# Patient Record
Sex: Female | Born: 1960 | Race: White | Hispanic: No | Marital: Single | Smoking: Never smoker
Health system: Southern US, Community
[De-identification: ages and names within clinical notes are randomized; demographics above are authoritative.]

## PROBLEM LIST (undated history)

## (undated) DIAGNOSIS — M1990 Unspecified osteoarthritis, unspecified site: Secondary | ICD-10-CM

## (undated) DIAGNOSIS — G4730 Sleep apnea, unspecified: Secondary | ICD-10-CM

## (undated) DIAGNOSIS — N950 Postmenopausal bleeding: Secondary | ICD-10-CM

## (undated) DIAGNOSIS — D649 Anemia, unspecified: Secondary | ICD-10-CM

## (undated) DIAGNOSIS — E119 Type 2 diabetes mellitus without complications: Secondary | ICD-10-CM

## (undated) DIAGNOSIS — K760 Fatty (change of) liver, not elsewhere classified: Secondary | ICD-10-CM

## (undated) DIAGNOSIS — E785 Hyperlipidemia, unspecified: Secondary | ICD-10-CM

## (undated) DIAGNOSIS — E039 Hypothyroidism, unspecified: Secondary | ICD-10-CM

## (undated) DIAGNOSIS — K635 Polyp of colon: Secondary | ICD-10-CM

## (undated) DIAGNOSIS — N8500 Endometrial hyperplasia, unspecified: Secondary | ICD-10-CM

## (undated) DIAGNOSIS — N840 Polyp of corpus uteri: Secondary | ICD-10-CM

## (undated) DIAGNOSIS — F419 Anxiety disorder, unspecified: Secondary | ICD-10-CM

## (undated) DIAGNOSIS — I10 Essential (primary) hypertension: Secondary | ICD-10-CM

## (undated) HISTORY — PX: JOINT REPLACEMENT: SHX530

## (undated) HISTORY — PX: TONSILLECTOMY: SUR1361

## (undated) HISTORY — PX: ANKLE FRACTURE SURGERY: SHX122

## (undated) HISTORY — PX: MENISCECTOMY: SHX123

## (undated) HISTORY — PX: FRACTURE SURGERY: SHX138

## (undated) HISTORY — DX: Endometrial hyperplasia, unspecified: N85.00

## (undated) HISTORY — PX: DILATION AND CURETTAGE OF UTERUS: SHX78

## (undated) HISTORY — PX: CYSTOSCOPY: SUR368

## (undated) HISTORY — PX: OTHER SURGICAL HISTORY: SHX169

## (undated) SURGERY — Surgical Case
Anesthesia: *Unknown

---

## 2004-09-01 ENCOUNTER — Ambulatory Visit: Payer: Self-pay | Admitting: Gastroenterology

## 2004-10-23 ENCOUNTER — Ambulatory Visit: Payer: Self-pay | Admitting: Podiatry

## 2005-08-24 ENCOUNTER — Ambulatory Visit: Payer: Self-pay

## 2005-09-10 ENCOUNTER — Ambulatory Visit: Payer: Self-pay

## 2006-06-21 ENCOUNTER — Ambulatory Visit: Payer: Self-pay

## 2006-08-09 ENCOUNTER — Ambulatory Visit: Payer: Self-pay

## 2007-01-18 ENCOUNTER — Ambulatory Visit: Payer: Self-pay | Admitting: Internal Medicine

## 2007-02-11 ENCOUNTER — Emergency Department: Payer: Self-pay | Admitting: Emergency Medicine

## 2007-07-14 ENCOUNTER — Ambulatory Visit: Payer: Self-pay | Admitting: Internal Medicine

## 2007-10-29 ENCOUNTER — Ambulatory Visit: Payer: Self-pay | Admitting: Internal Medicine

## 2007-11-21 ENCOUNTER — Ambulatory Visit: Payer: Self-pay | Admitting: Internal Medicine

## 2007-12-05 ENCOUNTER — Ambulatory Visit: Payer: Self-pay | Admitting: Gastroenterology

## 2008-01-23 ENCOUNTER — Ambulatory Visit: Payer: Self-pay | Admitting: Internal Medicine

## 2008-03-12 ENCOUNTER — Ambulatory Visit: Payer: Self-pay

## 2008-06-19 ENCOUNTER — Ambulatory Visit: Payer: Self-pay | Admitting: Orthopaedic Surgery

## 2008-06-28 ENCOUNTER — Ambulatory Visit: Payer: Self-pay | Admitting: Orthopaedic Surgery

## 2009-01-23 ENCOUNTER — Ambulatory Visit: Payer: Self-pay | Admitting: Internal Medicine

## 2010-01-26 ENCOUNTER — Ambulatory Visit: Payer: Self-pay | Admitting: Internal Medicine

## 2011-03-02 ENCOUNTER — Ambulatory Visit: Payer: Self-pay | Admitting: Internal Medicine

## 2011-09-01 ENCOUNTER — Ambulatory Visit: Payer: Self-pay | Admitting: Internal Medicine

## 2012-03-02 ENCOUNTER — Ambulatory Visit: Payer: Self-pay | Admitting: Internal Medicine

## 2012-10-04 ENCOUNTER — Ambulatory Visit: Payer: Self-pay | Admitting: Orthopedic Surgery

## 2012-10-04 LAB — BASIC METABOLIC PANEL
Anion Gap: 8 (ref 7–16)
BUN: 13 mg/dL (ref 7–18)
Calcium, Total: 8.9 mg/dL (ref 8.5–10.1)
Chloride: 103 mmol/L (ref 98–107)
Co2: 27 mmol/L (ref 21–32)
EGFR (Non-African Amer.): >60
Osmolality: 277 (ref 275–301)
Potassium: 4.2 mmol/L (ref 3.5–5.1)
Sodium: 138 mmol/L (ref 136–145)

## 2012-10-04 LAB — CBC
HCT: 37.4 % (ref 35.0–47.0)
HGB: 12.8 g/dL (ref 12.0–16.0)
MCH: 28.6 pg (ref 26.0–34.0)
MCHC: 34.3 g/dL (ref 32.0–36.0)
RDW: 14.9 % — ABNORMAL HIGH (ref 11.5–14.5)

## 2012-10-09 ENCOUNTER — Ambulatory Visit: Payer: Self-pay | Admitting: Orthopedic Surgery

## 2013-01-15 ENCOUNTER — Encounter: Payer: Self-pay | Admitting: Orthopedic Surgery

## 2013-02-06 ENCOUNTER — Encounter: Payer: Self-pay | Admitting: Orthopedic Surgery

## 2013-03-05 ENCOUNTER — Ambulatory Visit: Payer: Self-pay | Admitting: Internal Medicine

## 2013-11-08 HISTORY — PX: CARDIAC CATHETERIZATION: SHX172

## 2014-01-16 LAB — TROPONIN I: Troponin-I: <0.02 ng/mL

## 2014-01-16 LAB — CBC
HCT: 42.3 % (ref 35.0–47.0)
HGB: 14.2 g/dL (ref 12.0–16.0)
MCH: 28.7 pg (ref 26.0–34.0)
MCHC: 33.6 g/dL (ref 32.0–36.0)
MCV: 85 fL (ref 80–100)
Platelet: 261 10*3/uL (ref 150–440)
RBC: 4.96 10*6/uL (ref 3.80–5.20)
RDW: 14.6 % — ABNORMAL HIGH (ref 11.5–14.5)
WBC: 8.2 10*3/uL (ref 3.6–11.0)

## 2014-01-16 LAB — BASIC METABOLIC PANEL
Anion Gap: 8 (ref 7–16)
BUN: 13 mg/dL (ref 7–18)
CHLORIDE: 105 mmol/L (ref 98–107)
CO2: 27 mmol/L (ref 21–32)
CREATININE: 1.05 mg/dL (ref 0.60–1.30)
Calcium, Total: 9 mg/dL (ref 8.5–10.1)
EGFR (Non-African Amer.): >60
GLUCOSE: 105 mg/dL — AB (ref 65–99)
Osmolality: 280 (ref 275–301)
Potassium: 4.1 mmol/L (ref 3.5–5.1)
Sodium: 140 mmol/L (ref 136–145)

## 2014-01-17 ENCOUNTER — Ambulatory Visit: Payer: Self-pay | Admitting: Internal Medicine

## 2014-01-17 LAB — TROPONIN I: Troponin-I: <0.02 ng/mL

## 2014-01-17 LAB — CK TOTAL AND CKMB (NOT AT ARMC)
CK, Total: 141 U/L
CK, Total: 112 U/L
CK, Total: 133 U/L
CK-MB: 1.1 ng/mL (ref 0.5–3.6)
CK-MB: 1.4 ng/mL (ref 0.5–3.6)
CK-MB: 1.8 ng/mL (ref 0.5–3.6)

## 2014-04-29 ENCOUNTER — Ambulatory Visit: Payer: Self-pay | Admitting: Internal Medicine

## 2015-01-02 IMAGING — MG MM DIGITAL SCREENING BILAT W/ CAD
1 series · 5 of 5 positions shown · non-contrast
Comparison: Previous exam(s).

CLINICAL DATA: Screening.

EXAM:
DIGITAL SCREENING BILATERAL MAMMOGRAM WITH CAD

[R CC · right · 5 of 5 slices shown]
[im 1/5]
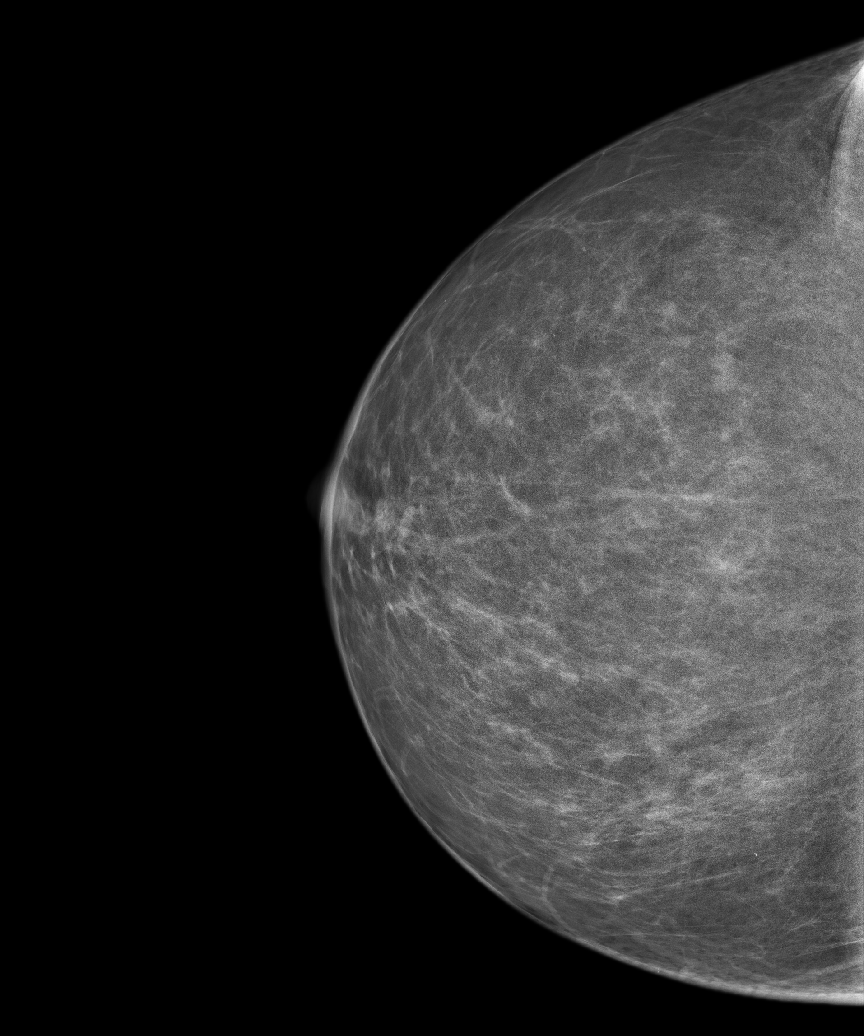
[im 2/5]
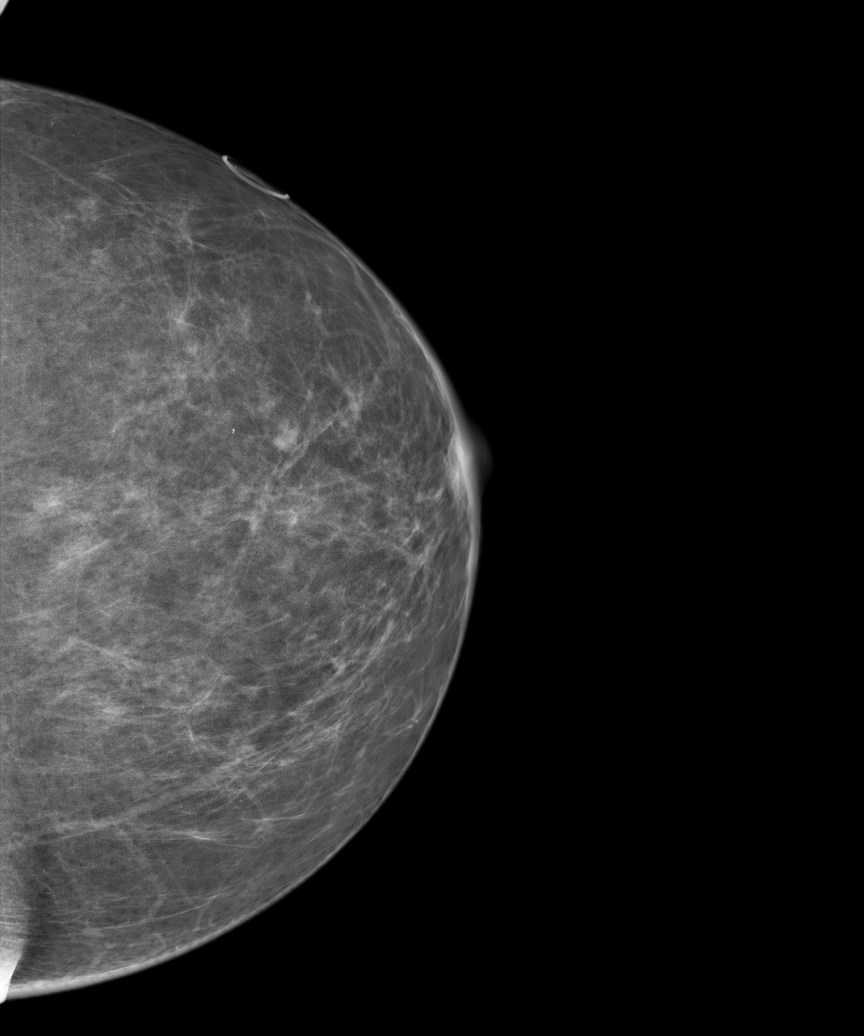
[im 3/5]
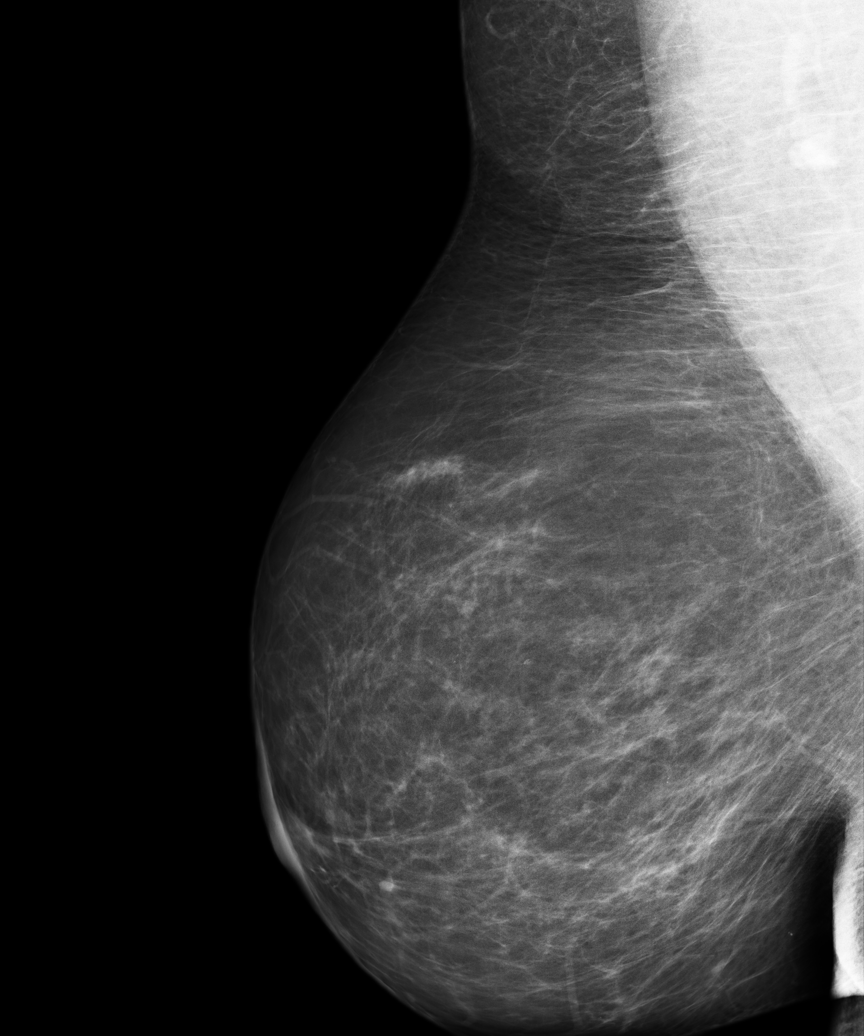
[im 4/5]
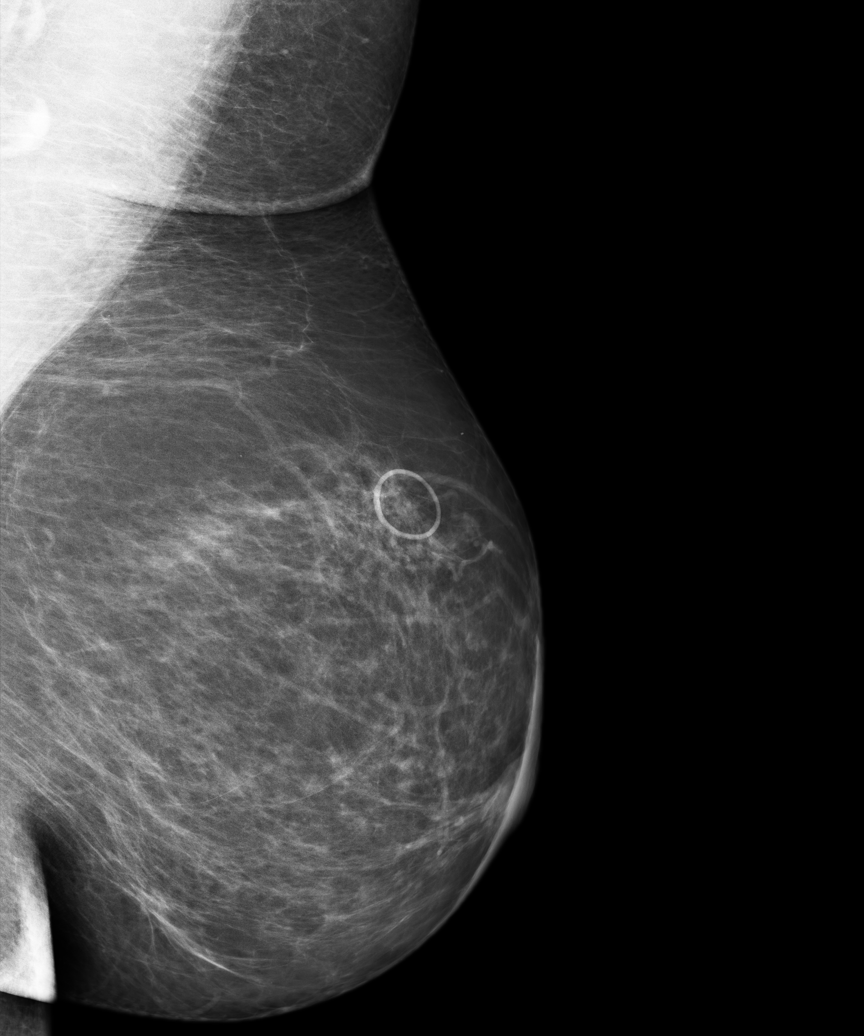
[im 5/5]
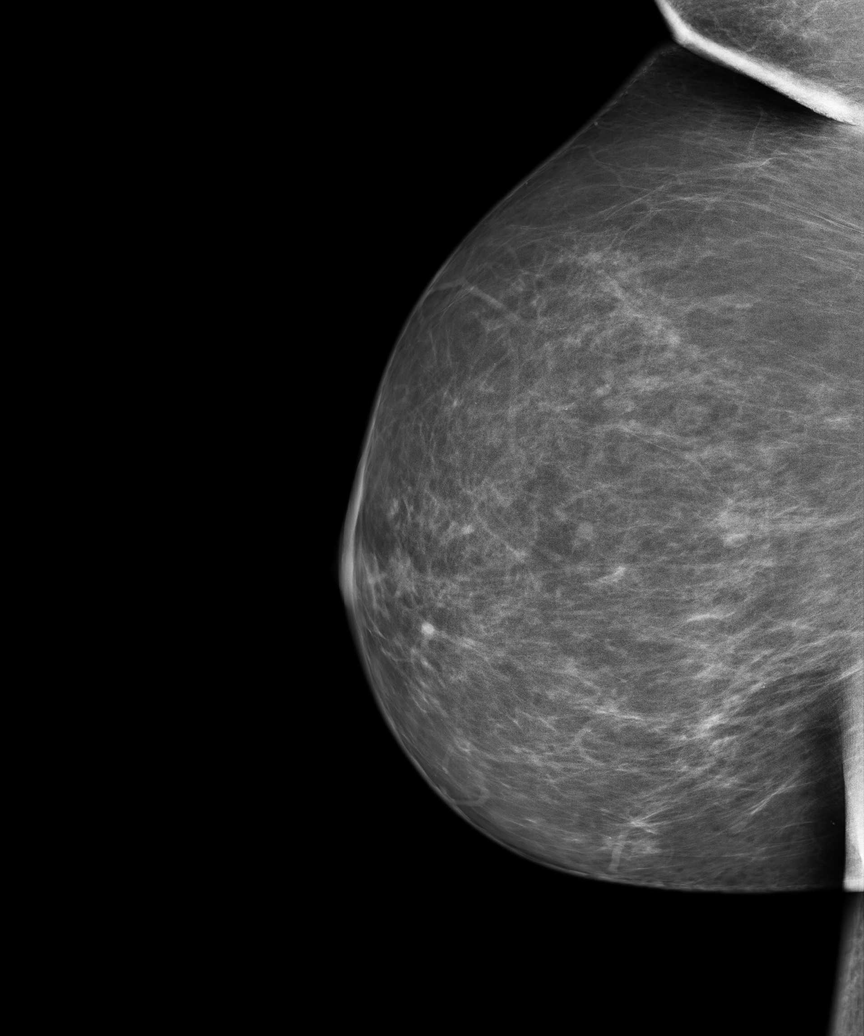

[5 of 5 positions shown; findings below may reference images not displayed]

ACR Breast Density Category b: There are scattered areas of
fibroglandular density.
FINDINGS: There are no findings suspicious for malignancy. Images were
processed with CAD.
IMPRESSION: No mammographic evidence of malignancy. A result letter of this
screening mammogram will be mailed directly to the patient.

RECOMMENDATION:
Screening mammogram in one year. (Code:AS-G-LCT)

BI-RADS CATEGORY  1: Negative.

## 2015-02-25 NOTE — Op Note (Signed)
PATIENT NAME:  Jill Yu, Jill Yu MR#:  [id number] DATE OF BIRTH:  12-Sep-1961  DATE OF PROCEDURE:  10/09/2012  PREOPERATIVE DIAGNOSES:  1. Menorrhagia.  2. 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  3. Thickened endometrium on ultrasound.   POSTOPERATIVE DIAGNOSES:  1. Menorrhagia.  2. 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  3. Thickened endometrium on ultrasound.  4. Endometrial polyps.   OPERATIVE PROCEDURE:  1. Diagnostic hysteroscopy.  2. Dilation and curettage of the endometrium.   SURGEON: Alanda Slim. Brionne Mertz, MD   FIRST ASSISTANT: None.   ANESTHESIA: General.   INDICATIONS: The patient is a 54 year old white female, para zero, with a history of simple endometrial hyperplasia without atypia on several occasions, currently on cyclic Provera with most recent history of menorrhagia, with preoperative ultrasound showing a thickened endometrium, presents for surgical evaluation.   FINDINGS AT SURGERY: Findings at surgery revealed a uterus that was midplane to retroverted and top normal size. There was no significant uterine descensus. The uterus sounded to 9 cm. Hysteroscopy revealed multiple endometrial polyps. The bony pelvis was marginal for vaginal surgery as she had a prominent pubic arch and convergent sidewalls.   DESCRIPTION OF PROCEDURE: The patient was brought to the Operating Room where she was placed in the supine position. General anesthesia was induced without difficulty. The patient had previously had a carpal tunnel release done by Dr. Rudene Christians [facility]. Following completion of that procedure, she was repositioned and placed in the dorsal lithotomy position using the bumblebee stirrups. A Betadine perineal, intravaginal prep and drape was performed in the standard fashion. Bimanual exam revealed the above-noted findings. A Sims retractor was placed posteriorly to facilitate exposure. A single-tooth tenaculum was placed on the  anterior lip of the cervix. The uterus sounded to 9 cm. The [facility] hysteroscope using lactated Ringer's as irrigant was used to identify the intrauterine findings which were photo documented. Smooth and serrated curettes were used to perform the curettage in standard fashion. Stone polyp forceps were used to facilitate polyp removal. Repeat hysteroscopy revealed several persistent polyps and repeat curettage was performed. The hysteroscope verified no significant residual tissue was left behind. The patient was then mobilized at the end of the procedure following removal of all instruments from within the vagina. She was extubated and taken to the recovery room in satisfactory condition. Estimated blood loss was minimal. There were no complications. All instruments, needle and sponge counts were verified as correct.   ADDENDUM NOTE: If the patient requires to have hysterectomy because of persistent hyperplasia, persistent chronic abnormal uterine bleeding, then either abdominal or robotic hysterectomy would be recommended.  ____________________________ Alanda Slim. Gayla Benn, MD mad:cbb D: 10/09/2012 13:31:44 ET T: 10/09/2012 13:54:29 ET JOB#: [id number]  cc: Hassell Done A. Chang Tiggs, MD, <Dictator> Alanda Slim Abrar Bilton MD ELECTRONICALLY SIGNED 10/09/2012 16:24

## 2015-02-25 NOTE — Op Note (Signed)
PATIENT NAME:  Jill Yu, Jill Yu MR#:  [id number] DATE OF BIRTH:  05-28-1961  DATE OF PROCEDURE:  10/09/2012  PREOPERATIVE DIAGNOSIS: Carpal tunnel syndrome.   POSTOPERATIVE DIAGNOSIS: Carpal tunnel syndrome.   PROCEDURE: Right carpal tunnel release.   SURGEON: Laurene Footman, MD  ANESTHESIA: General.   DESCRIPTION OF PROCEDURE: The patient was brought to the Operating Room and after adequate anesthesia was obtained the right arm was prepped and draped in the usual sterile fashion with a tourniquet applied to the upper arm. After appropriate patient identification and time out procedures were completed, the tourniquet was raised to 250 mmHg. An incision was made in the mid palm in line with the ring metacarpal, approximately 2 cm incision made. Skin and subcutaneous tissue was incised and local anesthetic was given as well with 10 mL 0.5% Sensorcaine injected. After getting down to the transverse carpal ligament, the ligament was incised and a hemostat placed deep to the ligament to protect the underlying structures. Release was carried out distally until there was fat found around the nerve. Going more proximally, release was carried out, but there was not good vascular blush to the nerve and the incision was extended an additional centimeter. On exploration of the nerve, there was a band that seemed like an aberrant band of the transcarpal ligament that was pressing directly on the nerve. After release of this, there was good vascular blush to the nerve and it appeared that complete release had been obtained. There was mild flexor tenosynovitis. The wound was irrigated and closed with simple interrupted 5-0 nylon. Additional 5 mL of 0.5% Sensorcaine was injected around the incision to aid in postoperative analgesia. Sterile dressings were applied with Xeroform, 4 x 4's, Webril, and Ace wrap. Tourniquet was let down at the close of the case.   TOURNIQUET TIME: 13 minutes at 250 mmHg.    COMPLICATIONS: None.  SPECIMEN: No specimen.   ESTIMATED BLOOD LOSS: Minimal.   The patient is then to have a procedure by Dr. Hassell Done DeFrancesco, to be dictated separately, unrelated to this surgical procedure. ____________________________ Laurene Footman, MD mjm:slb D: 10/09/2012 14:03:41 ET T: 10/09/2012 14:12:45 ET JOB#: [id number]  cc: Laurene Footman, MD, <Dictator> Laurene Footman MD ELECTRONICALLY SIGNED 10/10/2012 9:45

## 2015-03-01 NOTE — Consult Note (Signed)
Present Illness 54 yo female with no rior cardiac history who presented to the er with complaints of epigastric pain and jaw pain. She had a stress echo done in [facility] office on Monday per Rusty Aus read as inferior ischemia. She was scheduled to she cardiology today. She developed the aforementioned symptoms and presented to the er.Marland Kitchen EKG was unremarkable for ischemia. Her initial troponin was normal. She is improved but still not feeling well. Risk factors include diabetes mellitus and hypertension. She is currently hemodynamically stable   Physical Exam:  GEN well nourished, no acute distress   HEENT PERRL, hearing intact to voice   NECK supple   RESP normal resp effort  clear BS  no use of accessory muscles   CARD Regular rate and rhythm  Normal, S1, S2  No murmur   ABD denies tenderness  no liver/spleen enlargement  no hernia  no Adominal Mass   LYMPH negative neck, negative axillae   EXTR negative cyanosis/clubbing, negative edema   SKIN normal to palpation   NEURO cranial nerves intact, motor/sensory function intact   PSYCH A+O to time, place, person   Review of Systems:  Subjective/Chief Complaint heart burn and episodic jaw discomfort   General: Fatigue   Skin: No Complaints   ENT: No Complaints   Eyes: No Complaints   Neck: Pain   Respiratory: No Complaints   Cardiovascular: Chest pain or discomfort   Gastrointestinal: Heartburn   Genitourinary: No Complaints   Vascular: No Complaints   Musculoskeletal: No Complaints   Neurologic: No Complaints   Hematologic: No Complaints   Endocrine: No Complaints   Psychiatric: No Complaints   Review of Systems: All other systems were reviewed and found to be negative   Medications/Allergies Reviewed Medications/Allergies reviewed   Home Medications: Medication Instructions Status  metFORMIN 1000 mg oral tablet 1 tab(s) orally 2 times a day Active  aspirin 81 mg oral enteric coated tablet 1  orally once  a day (in the evening) DISCONTINUE ONE WEEK BEFORE SURGERY Active  lisinopril 10 mg oral tablet 1  orally 2 times a day TAKE AM OF SURGERY Active  Lantus Solostar Pen 100 units/mL subcutaneous solution 60 unit(s) subcutaneous once a day Active  Apidra SoloStar Pen 100 units/mL subcutaneous solution 4 unit(s) subcutaneous per 10 carbs with at mealtimes Active  atorvastatin 40 mg oral tablet 1 tab(s) orally once a day (at bedtime) Active  citalopram 20 mg oral tablet 1 tab(s) orally once a day (at bedtime) Active  Vitamin B12 2500 mcg sublingual tablet 1 tab(s) sublingual once a day Active   EKG:  EKG Nml  NSR    Cephalosporins: Itching   Impression 54 yo female with history of hyperlipidemia, hypertension and diabetes mellitus who had an abnormal stress echo last week with inferior ischemia. She developed heart burn and jaw pain last pm. She presented to the eer where her ekg and cardiac markers are unremarkable thus far. She is improved clinically. Given abnormal stress echo, risk factors including diabetes, will need to proceed with left cardiac cath to evaluate anatomy. Risk and benefits of cardiac cath explained.   Plan 1. Continue with asa, atorvastatin, and will add beta blockers 2. Continue to rule out for mi 3. Proceed with cardaic cath to evaluate anatomy 4. Further recs pending cath.   Electronic Signatures: Teodoro Spray (MD)  (Signed 12-Mar-15 08:24)  Authored: General Aspect/Present Illness, History and Physical Exam, Review of System, Home Medications, EKG , Allergies, Impression/Plan  Last Updated: 12-Mar-15 08:24 by Teodoro Spray (MD)

## 2015-03-01 NOTE — H&P (Signed)
PATIENT NAME:  Jill Yu, Jill Yu MR#:  [id number] DATE OF BIRTH:  [birth year]  DATE OF ADMISSION:  01/16/2014  REFERRING PHYSICIAN:  Dr. Marjean Donna.   PRIMARY CARE PHYSICIAN:  Dr. Fulton Reek, [facility]   CHIEF COMPLAINT:  Chest pain.   HISTORY OF PRESENT ILLNESS:  A 54 year old Caucasian female with past medical history of hypertension, diabetes and hyperlipidemia who recently had an abnormal stress test presenting with chest pain.  She describes one day duration of chest pain which is mild in intensity, retrosternal in location, described in quality as indigestion.  She does note radiation to the right jaw.  She has no associated symptoms including nausea, vomiting, diaphoresis, shortness of breath, however does complain of shortness of breath which has been progressively worsening over the past two months.  Currently, she is symptom-free.  EKG as well as cardiac enzymes have been within normal limits.   REVIEW OF SYSTEMS:  CONSTITUTIONAL:  Denies fever, chills, fatigue.  EYES:  Denied blurred vision, double vision, eye pain.  EARS, NOSE, THROAT:  Denies tinnitus, ear pain, hearing loss. RESPIRATORY:  Denies cough, wheeze, shortness of breath.  CARDIOVASCULAR:  Positive for chest pain as described above.  Denies orthopnea or edema. GASTROINTESTINAL:  Denies nausea, vomiting, diarrhea, abdominal pain.  GENITOURINARY:   Denies dysuria, hematuria.  ENDOCRINE:  Denies nocturia or thyroid problems. HEMATOLOGY:  Denies easy bruising or bleeding. SKIN:  Denies rash or lesion.  MUSCULOSKELETAL:  Denies pain in neck, back, shoulder, knees, hips or arthritic symptoms.  NEUROLOGIC:  Denies paralysis, paresthesias.  PSYCHIATRIC:  Denies anxiety or depressive symptoms.  Otherwise, full review of systems performed by me is negative.   PAST MEDICAL HISTORY:  Hypertension, type 2 diabetes insulin requiring, hyperlipidemia.   SOCIAL HISTORY:  Denies alcohol, tobacco or drug usage.    FAMILY HISTORY:  Positive for mother with type I diabetes.   ALLERGIES:  CEPHALOSPORINS.   HOME MEDICATIONS:  Include aspirin 81 mg by mouth daily, lisinopril 10 mg by mouth twice daily, citalopram 20 mg by mouth daily, Lantus 60 units daily, atorvastatin 40 mg by mouth at bedtime, Prilosec 20 mg by mouth daily, metformin 1000 mg by mouth twice daily.   PHYSICAL EXAMINATION: VITAL SIGNS:  Temperature 98.4, heart rate 81, respirations 18 blood pressure 158/72, saturating 100% on room air.  Weight 101.6 kg, BMI of 35.1.  GENERAL:  Well-nourished, well-developed Caucasian female, currently in no acute distress.  HEAD:  Normocephalic, atraumatic.  EYES:  Pupils equal, round, reactive to light.  Extraocular muscles intact.  No scleral icterus.  MOUTH:  Moist mucous membranes.  Dentition intact.  No abscess noted.  EARS, NOSE, THROAT:  Throat clear without exudates.  No external lesions.  NECK:  Supple.  No thyromegaly.  No nodules.  No JVD.  PULMONARY:  Clear to auscultation bilaterally without wheezes, rales or rhonchi.  No use of accessory muscles.  Good respiratory effort.  Chest nontender to palpation. CARDIOVASCULAR:  S1, S2, regular rate and rhythm.  No murmurs, rubs or gallops.  No edema.  Pedal pulses 2+ bilaterally.  GASTROINTESTINAL:  Soft, nontender, nondistended.  No masses.  Positive bowel sounds.  No hepatosplenomegaly.  MUSCULOSKELETAL:  No swelling, clubbing, edema.  Range of motion full in all extremities.  NEUROLOGIC:  Cranial nerves II through XII intact.  No gross focal neurological deficits.  Sensation intact.  Reflexes intact. SKIN:  No ulcerations, lesions, rashes, cyanosis.  Skin warm, dry.  Turgor intact.  PSYCHIATRIC:  Mood and affect within  normal limits.  The patient is alert and oriented x 3.  Insight and judgment intact.   LABORATORY DATA:  EKG:  Normal sinus rhythm, no ST or T wave abnormalities.  D-dimer is 0.25.  Chest x-ray, no acute cardiopulmonary process.   Remainder of laboratory data:  Sodium 140, potassium 4.1, chloride 105, bicarb 27, BUN 13, creatinine 1.05, glucose 105.  Troponin I less than 0.02.  WBC 8.2, hemoglobin 14.2, platelets 261.   ASSESSMENT AND PLAN:  A 54 year old Caucasian female with history of hypertension, hyperlipidemia, diabetes with recent abnormal stress test, presenting with chest pain.  1.  Atypical chest pain.  Place on telemetry under observation.  Cardiac enzymes trending x 2.  We will consult cardiology given that she has abnormal stress test which is self-reported as inferior ischemia.  2.  Diabetes.  Continue Lantus.  Hold by mouth agents.  Add insulin sliding scale with q. 6 hour Accu-Cheks.  3.  Hypertension.  Continue lisinopril.  4.  Hyperlipidemia.  Continue statin therapy.  5.  Venous thromboembolism prophylaxis with sequential compression devices. 6.  CODE STATUS:  THE PATIENT IS A FULL CODE.   TIME SPENT:  45 minutes.     ____________________________ Aaron Mose. Hower, MD dkh:ea D: 01/17/2014 02:21:04 ET T: 01/17/2014 03:20:49 ET JOB#: [id number]  cc: Aaron Mose. Hower, MD, <Dictator> DAVID Woodfin Ganja MD ELECTRONICALLY SIGNED 01/17/2014 20:23

## 2015-03-26 ENCOUNTER — Inpatient Hospital Stay: Admission: RE | Admit: 2015-03-26 | Payer: Self-pay | Source: Ambulatory Visit

## 2015-03-28 ENCOUNTER — Encounter: Payer: Self-pay | Admitting: *Deleted

## 2015-03-28 DIAGNOSIS — Z7982 Long term (current) use of aspirin: Secondary | ICD-10-CM | POA: Diagnosis not present

## 2015-03-28 DIAGNOSIS — Z79899 Other long term (current) drug therapy: Secondary | ICD-10-CM | POA: Diagnosis not present

## 2015-03-28 DIAGNOSIS — Z86010 Personal history of colonic polyps: Secondary | ICD-10-CM | POA: Diagnosis not present

## 2015-03-28 DIAGNOSIS — N950 Postmenopausal bleeding: Secondary | ICD-10-CM | POA: Diagnosis not present

## 2015-03-28 DIAGNOSIS — Z881 Allergy status to other antibiotic agents status: Secondary | ICD-10-CM | POA: Diagnosis not present

## 2015-03-28 DIAGNOSIS — E109 Type 1 diabetes mellitus without complications: Secondary | ICD-10-CM | POA: Diagnosis not present

## 2015-03-28 DIAGNOSIS — Z800 Family history of malignant neoplasm of digestive organs: Secondary | ICD-10-CM | POA: Diagnosis not present

## 2015-03-28 DIAGNOSIS — Z8489 Family history of other specified conditions: Secondary | ICD-10-CM | POA: Diagnosis not present

## 2015-03-28 DIAGNOSIS — Z833 Family history of diabetes mellitus: Secondary | ICD-10-CM | POA: Diagnosis not present

## 2015-03-28 DIAGNOSIS — Z803 Family history of malignant neoplasm of breast: Secondary | ICD-10-CM | POA: Diagnosis not present

## 2015-03-28 DIAGNOSIS — N840 Polyp of corpus uteri: Secondary | ICD-10-CM | POA: Diagnosis present

## 2015-03-28 DIAGNOSIS — E669 Obesity, unspecified: Secondary | ICD-10-CM | POA: Diagnosis not present

## 2015-03-28 DIAGNOSIS — N8500 Endometrial hyperplasia, unspecified: Secondary | ICD-10-CM | POA: Diagnosis not present

## 2015-03-28 DIAGNOSIS — D649 Anemia, unspecified: Secondary | ICD-10-CM | POA: Diagnosis not present

## 2015-03-28 DIAGNOSIS — Z794 Long term (current) use of insulin: Secondary | ICD-10-CM | POA: Diagnosis not present

## 2015-03-28 DIAGNOSIS — M179 Osteoarthritis of knee, unspecified: Secondary | ICD-10-CM | POA: Diagnosis not present

## 2015-03-28 DIAGNOSIS — I10 Essential (primary) hypertension: Secondary | ICD-10-CM | POA: Diagnosis not present

## 2015-03-28 DIAGNOSIS — N393 Stress incontinence (female) (male): Secondary | ICD-10-CM | POA: Diagnosis not present

## 2015-03-28 DIAGNOSIS — G4730 Sleep apnea, unspecified: Secondary | ICD-10-CM | POA: Diagnosis not present

## 2015-03-28 DIAGNOSIS — G4733 Obstructive sleep apnea (adult) (pediatric): Secondary | ICD-10-CM | POA: Diagnosis not present

## 2015-03-28 DIAGNOSIS — Z6837 Body mass index (BMI) 37.0-37.9, adult: Secondary | ICD-10-CM | POA: Diagnosis not present

## 2015-03-28 NOTE — Patient Instructions (Signed)
  Your procedure is scheduled on 03-31-15 Report to [facility]  To find out your arrival time please call [phone] between 1PM - 3PM on 03-28-15  Remember: Instructions that are not followed completely may result in serious medical risk, up to and including death, or upon the discretion of your surgeon and anesthesiologist your surgery may need to be rescheduled.    __X__ 1. Do not eat food or drink liquids after midnight. No gum chewing or hard candies.     __X__ 2. No Alcohol for 24 hours before or after surgery.   ____ 3. Bring all medications with you on the day of surgery if instructed.    ____ 4. Notify your doctor if there is any change in your medical condition     (cold, fever, infections).     Do not wear jewelry, make-up, hairpins, clips or nail polish.  Do not wear lotions, powders, or perfumes. You may wear deodorant.  Do not shave 48 hours prior to surgery. Men may shave face and neck.  Do not bring valuables to the hospital.    [facility] is not responsible for any belongings or valuables.               Contacts, dentures or bridgework may not be worn into surgery.  Leave your suitcase in the car. After surgery it may be brought to your room.  For patients admitted to the hospital, discharge time is determined by your                treatment team.   Patients discharged the day of surgery will not be allowed to drive home.   Please read over the following fact sheets that you were given:     __X__ Take these medicines the morning of surgery with A SIP OF WATER:    1.LISINOPRIL  2. ONLY TAKE HALF OF TOUJEO INSULIN Sunday NIGHT 40 UNITS  3. NO INSULIN THE MORNING OF SURGERY  4.  5.  6.  ____ Fleet Enema (as directed)   ____ Use CHG Soap as directed  ____ Use inhalers on the day of surgery  _X___ Stop metformin 2 days prior to surgery    _X__ Take 1/2 of usual insulin dose the night before surgery and none on the morning of surgery.   __X__ Stop  Coumadin/Plavix/aspirin NOW  ____ Stop Anti-inflammatories TYLENOL OK TO TAKE   ____ Stop supplements until after surgery.    __X__ Bring C-Pap to the hospital.

## 2015-03-31 ENCOUNTER — Encounter: Payer: Self-pay | Admitting: *Deleted

## 2015-03-31 ENCOUNTER — Encounter
Admission: RE | Disposition: A | Discharge status: Home / Self Care | Payer: Self-pay | Source: Ambulatory Visit | Attending: Obstetrics and Gynecology

## 2015-03-31 ENCOUNTER — Ambulatory Visit: Payer: BLUE CROSS/BLUE SHIELD | Admitting: Anesthesiology

## 2015-03-31 ENCOUNTER — Ambulatory Visit
Admission: RE | Admit: 2015-03-31 | Discharge: 2015-03-31 | Disposition: A | Discharge status: Home / Self Care | Payer: BLUE CROSS/BLUE SHIELD | Source: Ambulatory Visit | Attending: Obstetrics and Gynecology | Admitting: Obstetrics and Gynecology

## 2015-03-31 DIAGNOSIS — Z794 Long term (current) use of insulin: Secondary | ICD-10-CM | POA: Insufficient documentation

## 2015-03-31 DIAGNOSIS — N393 Stress incontinence (female) (male): Secondary | ICD-10-CM | POA: Insufficient documentation

## 2015-03-31 DIAGNOSIS — I10 Essential (primary) hypertension: Secondary | ICD-10-CM | POA: Diagnosis not present

## 2015-03-31 DIAGNOSIS — N840 Polyp of corpus uteri: Secondary | ICD-10-CM

## 2015-03-31 DIAGNOSIS — E669 Obesity, unspecified: Secondary | ICD-10-CM | POA: Insufficient documentation

## 2015-03-31 DIAGNOSIS — D649 Anemia, unspecified: Secondary | ICD-10-CM | POA: Insufficient documentation

## 2015-03-31 DIAGNOSIS — N950 Postmenopausal bleeding: Secondary | ICD-10-CM

## 2015-03-31 DIAGNOSIS — Z79899 Other long term (current) drug therapy: Secondary | ICD-10-CM | POA: Insufficient documentation

## 2015-03-31 DIAGNOSIS — Z800 Family history of malignant neoplasm of digestive organs: Secondary | ICD-10-CM | POA: Insufficient documentation

## 2015-03-31 DIAGNOSIS — Z6837 Body mass index (BMI) 37.0-37.9, adult: Secondary | ICD-10-CM | POA: Insufficient documentation

## 2015-03-31 DIAGNOSIS — Z803 Family history of malignant neoplasm of breast: Secondary | ICD-10-CM | POA: Insufficient documentation

## 2015-03-31 DIAGNOSIS — Z7982 Long term (current) use of aspirin: Secondary | ICD-10-CM | POA: Insufficient documentation

## 2015-03-31 DIAGNOSIS — Z86010 Personal history of colonic polyps: Secondary | ICD-10-CM | POA: Insufficient documentation

## 2015-03-31 DIAGNOSIS — G4730 Sleep apnea, unspecified: Secondary | ICD-10-CM | POA: Insufficient documentation

## 2015-03-31 DIAGNOSIS — M179 Osteoarthritis of knee, unspecified: Secondary | ICD-10-CM | POA: Insufficient documentation

## 2015-03-31 DIAGNOSIS — G4733 Obstructive sleep apnea (adult) (pediatric): Secondary | ICD-10-CM | POA: Insufficient documentation

## 2015-03-31 DIAGNOSIS — N8500 Endometrial hyperplasia, unspecified: Secondary | ICD-10-CM | POA: Insufficient documentation

## 2015-03-31 DIAGNOSIS — E109 Type 1 diabetes mellitus without complications: Secondary | ICD-10-CM | POA: Insufficient documentation

## 2015-03-31 DIAGNOSIS — Z833 Family history of diabetes mellitus: Secondary | ICD-10-CM | POA: Insufficient documentation

## 2015-03-31 DIAGNOSIS — Z881 Allergy status to other antibiotic agents status: Secondary | ICD-10-CM | POA: Insufficient documentation

## 2015-03-31 DIAGNOSIS — Z8489 Family history of other specified conditions: Secondary | ICD-10-CM | POA: Insufficient documentation

## 2015-03-31 HISTORY — DX: Unspecified osteoarthritis, unspecified site: M19.90

## 2015-03-31 HISTORY — DX: Polyp of corpus uteri: N84.0

## 2015-03-31 HISTORY — DX: Polyp of colon: K63.5

## 2015-03-31 HISTORY — DX: Postmenopausal bleeding: N95.0

## 2015-03-31 HISTORY — DX: Type 2 diabetes mellitus without complications: E11.9

## 2015-03-31 HISTORY — PX: HYSTEROSCOPY WITH D & C: SHX1775

## 2015-03-31 HISTORY — DX: Sleep apnea, unspecified: G47.30

## 2015-03-31 HISTORY — DX: Fatty (change of) liver, not elsewhere classified: K76.0

## 2015-03-31 HISTORY — DX: Anemia, unspecified: D64.9

## 2015-03-31 HISTORY — DX: Essential (primary) hypertension: I10

## 2015-03-31 HISTORY — DX: Hyperlipidemia, unspecified: E78.5

## 2015-03-31 LAB — POCT PREGNANCY, URINE: Preg Test, Ur: NEGATIVE

## 2015-03-31 LAB — CBC
HCT: 41.1 % (ref 35.0–47.0)
HEMOGLOBIN: 13.9 g/dL (ref 12.0–16.0)
MCH: 29.5 pg (ref 26.0–34.0)
MCHC: 33.9 g/dL (ref 32.0–36.0)
MCV: 86.9 fL (ref 80.0–100.0)
PLATELETS: 242 10*3/uL (ref 150–440)
RBC: 4.73 MIL/uL (ref 3.80–5.20)
RDW: 14.3 % (ref 11.5–14.5)
WBC: 6.1 10*3/uL (ref 3.6–11.0)

## 2015-03-31 LAB — BASIC METABOLIC PANEL
Anion gap: 7 (ref 5–15)
BUN: 11 mg/dL (ref 6–20)
CO2: 28 mmol/L (ref 22–32)
Calcium: 9.2 mg/dL (ref 8.9–10.3)
Chloride: 103 mmol/L (ref 101–111)
Creatinine, Ser: 1.11 mg/dL — ABNORMAL HIGH (ref 0.44–1.00)
GFR calc Af Amer: >60 mL/min (ref >60)
GFR, EST NON AFRICAN AMERICAN: 56 mL/min — AB (ref >60)
Glucose, Bld: 186 mg/dL — ABNORMAL HIGH (ref 65–99)
Potassium: 4.2 mmol/L (ref 3.5–5.1)
SODIUM: 138 mmol/L (ref 135–145)

## 2015-03-31 LAB — GLUCOSE, CAPILLARY
Glucose-Capillary: 180 mg/dL — ABNORMAL HIGH (ref 65–99)
Glucose-Capillary: 134 mg/dL — ABNORMAL HIGH (ref 65–99)

## 2015-03-31 SURGERY — DILATATION AND CURETTAGE /HYSTEROSCOPY
Anesthesia: General

## 2015-03-31 MED ORDER — ONDANSETRON HCL 4 MG/2ML IJ SOLN: 4.0000 mg | Freq: Once | INTRAMUSCULAR | Status: DC | PRN

## 2015-03-31 MED ORDER — FENTANYL CITRATE (PF) 100 MCG/2ML IJ SOLN
25.0000 ug | INTRAMUSCULAR | Status: DC | PRN
  Administered 2015-03-31 (×2): 25 ug via INTRAVENOUS

## 2015-03-31 MED ORDER — FENTANYL CITRATE (PF) 100 MCG/2ML IJ SOLN
INTRAMUSCULAR | Status: AC
  Filled 2015-03-31: qty 2

## 2015-03-31 MED ORDER — SODIUM CHLORIDE 0.9 % IV SOLN
INTRAVENOUS | Status: DC
  Administered 2015-03-31: 09:00:00 via INTRAVENOUS

## 2015-03-31 MED ORDER — FAMOTIDINE 20 MG PO TABS
20.0000 mg | ORAL_TABLET | Freq: Once | ORAL | Status: AC
  Administered 2015-03-31: 20 mg via ORAL

## 2015-03-31 MED ORDER — IBUPROFEN 800 MG PO TABS: 800.0000 mg | ORAL_TABLET | Freq: Three times a day (TID) | ORAL | Status: DC

## 2015-03-31 MED ORDER — SODIUM CHLORIDE 0.9 % IR SOLN
Status: DC | PRN
  Administered 2015-03-31: 500 mL

## 2015-03-31 MED ORDER — ONDANSETRON HCL 4 MG/2ML IJ SOLN
INTRAMUSCULAR | Status: DC | PRN
  Administered 2015-03-31: 4 mg via INTRAVENOUS

## 2015-03-31 MED ORDER — FAMOTIDINE 20 MG PO TABS
ORAL_TABLET | ORAL | Status: AC
  Filled 2015-03-31: qty 1

## 2015-03-31 MED ORDER — KETOROLAC TROMETHAMINE 30 MG/ML IJ SOLN
INTRAMUSCULAR | Status: DC | PRN
Start: 2015-03-31 — End: 2015-03-31
  Administered 2015-03-31: 30 mg via INTRAVENOUS

## 2015-03-31 MED ORDER — PROPOFOL 10 MG/ML IV BOLUS
INTRAVENOUS | Status: DC | PRN
  Administered 2015-03-31: 200 mg via INTRAVENOUS

## 2015-03-31 MED ORDER — FENTANYL CITRATE (PF) 100 MCG/2ML IJ SOLN
INTRAMUSCULAR | Status: DC | PRN
  Administered 2015-03-31 (×2): 50 ug via INTRAVENOUS

## 2015-03-31 MED ORDER — LIDOCAINE HCL (PF) 1 % IJ SOLN
INTRAMUSCULAR | Status: AC
  Filled 2015-03-31: qty 2

## 2015-03-31 MED ORDER — OXYCODONE-ACETAMINOPHEN 5-325 MG PO TABS: 1.0000 | ORAL_TABLET | ORAL | Status: DC | PRN

## 2015-03-31 MED ORDER — MIDAZOLAM HCL 2 MG/2ML IJ SOLN
INTRAMUSCULAR | Status: DC | PRN
  Administered 2015-03-31: 2 mg via INTRAVENOUS

## 2015-03-31 SURGICAL SUPPLY — 14 items
CATH ROBINSON RED A/P 16FR (CATHETERS) ×3 IMPLANT
GLOVE BIO SURGEON STRL SZ8 (GLOVE) ×6 IMPLANT
GOWN STRL REUS W/ TWL LRG LVL3 (GOWN DISPOSABLE) ×1 IMPLANT
GOWN STRL REUS W/ TWL XL LVL3 (GOWN DISPOSABLE) ×1 IMPLANT
GOWN STRL REUS W/TWL LRG LVL3 (GOWN DISPOSABLE) ×2
GOWN STRL REUS W/TWL XL LVL3 (GOWN DISPOSABLE) ×2
IV LACTATED RINGERS 1000ML (IV SOLUTION) ×3 IMPLANT
JELLY LUB 2OZ STRL (MISCELLANEOUS) ×2
JELLY LUBE 2OZ STRL (MISCELLANEOUS) ×1 IMPLANT
KIT RM TURNOVER CYSTO AR (KITS) ×3 IMPLANT
NS IRRIG 500ML POUR BTL (IV SOLUTION) ×3 IMPLANT
PACK DNC HYST (MISCELLANEOUS) ×3 IMPLANT
PAD OB MATERNITY 4.3X12.25 (PERSONAL CARE ITEMS) ×3 IMPLANT
PAD PREP 24X41 OB/GYN DISP (PERSONAL CARE ITEMS) ×3 IMPLANT

## 2015-03-31 NOTE — Discharge Instructions (Signed)
AMBULATORY SURGERY  DISCHARGE INSTRUCTIONS   1) The drugs that you were given will stay in your system until tomorrow so for the next 24 hours you should not:  A) Drive an automobile B) Make any legal decisions C) Drink any alcoholic beverage   2) You may resume regular meals tomorrow.  Today it is better to start with liquids and gradually work up to solid foods.  You may eat anything you prefer, but it is better to start with liquids, then soup and crackers, and gradually work up to solid foods.   3) Please notify your doctor immediately if you have any unusual bleeding, trouble breathing, redness and pain at the surgery site, drainage, fever, or pain not relieved by medication. 4)   5) Your post-operative visit with Dr.    George Ina                                 is: Date:                        Time:    Please call to schedule your post-operative visit.  6) Additional Instructions: 7)

## 2015-03-31 NOTE — Anesthesia Preprocedure Evaluation (Signed)
Anesthesia Evaluation  Patient identified by MRN, date of birth, ID band Patient awake    Reviewed: Allergy & Precautions, NPO status   History of Anesthesia Complications Negative for: history of anesthetic complications  Airway Mallampati: III       Dental no notable dental hx.    Pulmonary sleep apnea ,    Pulmonary exam normal       Cardiovascular hypertension, Pt. on medications Normal cardiovascular examRhythm:Regular Rate:Normal     Neuro/Psych negative neurological ROS  negative psych ROS   GI/Hepatic negative GI ROS, Neg liver ROS,   Endo/Other  diabetes, Well Controlled, Type 1, Insulin Dependent  Renal/GU negative Renal ROS  negative genitourinary   Musculoskeletal  (+) Arthritis -, Osteoarthritis,    Abdominal Normal abdominal exam  (+) + obese,   Peds negative pediatric ROS (+)  Hematology  (+) anemia ,   Anesthesia Other Findings   Reproductive/Obstetrics negative OB ROS                             Anesthesia Physical Anesthesia Plan  ASA: III  Anesthesia Plan: General   Post-op Pain Management:    Induction: Intravenous  Airway Management Planned: LMA  Additional Equipment:   Intra-op Plan:   Post-operative Plan: Extubation in OR  Informed Consent: I have reviewed the patients History and Physical, chart, labs and discussed the procedure including the risks, benefits and alternatives for the proposed anesthesia with the patient or authorized representative who has indicated his/her understanding and acceptance.     Plan Discussed with: CRNA and Surgeon  Anesthesia Plan Comments:         Anesthesia Quick Evaluation

## 2015-03-31 NOTE — Op Note (Signed)
OPERATIVE NOTE  Date of surgery: 03/31/2015  Preoperative diagnosis:  1. Post menopausal bleeding 2. History of endometrial hyperplasia without atypia 3. Endometrial polyp Postoperative diagnosis: Same as above Operative procedure: 1. Hysteroscopy 2. Dilation and curettage of endometrium and polypectomy  Surgeon: Dr. Zipporah Plants Assistant: None Anesthesia: Gen. LMA   Findings: Pelvis: Anthropoid pelvis Uterus: sounded to 8 cm Normal appearing endometrial cavity with a 0.7 x 1.5. Centimeter endometrial polyp.  Description of procedure Patient was brought to the operating room where she was placed in supine position. General anesthesia was induced without difficulty using the LMA technique. The patient was placed in dorsal lithotomy position using candy cane stirrups. A betadine perineal intravaginal prep and drape was performed in standard fashion. Sims retractor was placed in the vagina. Single-tooth tenaculum was placed on the anterior cervix. Uterus was sounded to 8 cm. Hanks dilators were used up to a #20 [place] caliber to dilate the endocervical canal. The [facility] hysteroscope using lactated Ringer's as irrigant was used for the hysteroscopy. The above-noted findings were photo documented. The hysteroscope removed. The smooth and serrated curettes were then used to curettage the endometrial cavity with production of average tissue. Stone polyp forceps were used to grasp residual tissue left behind. Repeat hysteroscopy was performed and demonstrated excellent sampling. Procedure was then terminated with all instrumentation being removed from the vagina. The patient was then awakened mobilized and taken to the recovery room in satisfactory condition.  IV fluids: 300 mL. Urine output: 400 mL. EBL: 25 mL.  Instruments, needles, and sponge counts were verified as correct.  [name], Hassell Done, MD 03/31/2015

## 2015-03-31 NOTE — Anesthesia Postprocedure Evaluation (Signed)
  Anesthesia Post-op Note  Patient: Jill Yu  Procedure(s) Performed: Procedure(s): DILATATION AND CURETTAGE /HYSTEROSCOPY (N/A)  Anesthesia type:General  Patient location: PACU  Post pain: Pain level controlled  Post assessment: Post-op Vital signs reviewed, Patient's Cardiovascular Status Stable, Respiratory Function Stable, Patent Airway and No signs of Nausea or vomiting  Post vital signs: Reviewed and stable  Last Vitals:  Filed Vitals:   03/31/15 1235  BP: 122/68  Pulse: 69  Temp:   Resp: 13    Level of consciousness: awake, alert  and patient cooperative  Complications: No apparent anesthesia complications

## 2015-03-31 NOTE — H&P (Signed)
Date of Initial H&P: 03/27/15  History reviewed, patient examined, no change in status, stable for surgery.

## 2015-03-31 NOTE — Anesthesia Procedure Notes (Signed)
Procedure Name: LMA Insertion Date/Time: 03/31/2015 11:29 AM Performed by: Jonna Clark Pre-anesthesia Checklist: Patient identified, Emergency Drugs available, Suction available, Patient being monitored and Timeout performed Patient Re-evaluated:Patient Re-evaluated prior to inductionOxygen Delivery Method: Circle system utilized Preoxygenation: Pre-oxygenation with 100% oxygen Intubation Type: IV induction Ventilation: Mask ventilation without difficulty LMA: LMA inserted LMA Size: 3.5 Number of attempts: 1 Placement Confirmation: positive ETCO2 and breath sounds checked- equal and bilateral Tube secured with: Tape Dental Injury: Teeth and Oropharynx as per pre-operative assessment

## 2015-03-31 NOTE — Transfer of Care (Signed)
Immediate Anesthesia Transfer of Care Note  Patient: Jill Yu  Procedure(s) Performed: Procedure(s): DILATATION AND CURETTAGE /HYSTEROSCOPY (N/A)  Patient Location: PACU  Anesthesia Type:General  Level of Consciousness: awake and lethargic  Airway & Oxygen Therapy: Patient Spontanous Breathing and Patient connected to face mask oxygen  Post-op Assessment: Report given to RN and Post -op Vital signs reviewed and stable  Post vital signs: Reviewed and stable  Last Vitals:  Filed Vitals:   03/31/15 0817  BP: 141/53  Pulse: 79  Temp: 36.8 C  Resp: 16    Complications: No apparent anesthesia complications

## 2015-04-01 ENCOUNTER — Encounter: Payer: Self-pay | Admitting: Obstetrics and Gynecology

## 2015-04-03 LAB — SURGICAL PATHOLOGY

## 2015-04-09 ENCOUNTER — Encounter: Payer: Self-pay | Admitting: Obstetrics and Gynecology

## 2015-04-09 ENCOUNTER — Ambulatory Visit (INDEPENDENT_AMBULATORY_CARE_PROVIDER_SITE_OTHER): Payer: BLUE CROSS/BLUE SHIELD | Admitting: Obstetrics and Gynecology

## 2015-04-09 VITALS — BP 94/68 | HR 60 | Ht 66.0 in | Wt 230.3 lb

## 2015-04-09 DIAGNOSIS — N950 Postmenopausal bleeding: Secondary | ICD-10-CM | POA: Insufficient documentation

## 2015-04-09 NOTE — Progress Notes (Signed)
OB/GYN CONFERENCE NOTE:  Subjective:       Jill Yu is a 54 y.o. G0P0 female who presents for a conference appointment. Current complaints include: Patient is 1 week status post hysteroscopy/D&C with polypectomy for evaluation of postmenopausal bleeding, history of endometrial hyperplasia, and endometrial polyps.  Patient is doing well with normal bowel bladder function.  Patient is having minimal vaginal spotting.  Pathology: Benign endometrium with evidence of endometrial polyps.  No endometrial hyperplasia or carcinoma seen.  Findings were reviewed with patient.  Patient will monitor for any further bleeding.  She understands that this will need to be reevaluated if it occurs greater than 6 months from now.  Routine postoperative precautions are given..     Gynecologic History No LMP recorded. Patient is postmenopausal. Contraception: none  Obstetric History OB History  Gravida Para Term Preterm AB SAB TAB Ectopic Multiple Living  0                 Past Medical History  Diagnosis Date  . Polyp of colon   . Diabetes mellitus without complication   . Nonalcoholic fatty liver disease   . Arthritis   . Hyperlipidemia   . Sleep apnea   . Hypertension   . Anemia   . Endometrial polyp 03/31/2015  . PMB (postmenopausal bleeding) 03/31/2015  . Endometrial hyperplasia     history of    Past Surgical History  Procedure Laterality Date  . Dilation and curettage of uterus    . Tonsillectomy    . Cystoscopy    . Meniscectomy Left   . Cardiac catheterization  2015  . Ankle fracture surgery    . Hysteroscopy w/d&c N/A 03/31/2015    Procedure: DILATATION AND CURETTAGE /HYSTEROSCOPY;  Surgeon: Brayton Mars, MD;  Location: [facility] ORS;  Service: Gynecology;  Laterality: N/A;  . Hysteroscopy/d&c, polypectomy      Current Outpatient Prescriptions on File Prior to Visit  Medication Sig Dispense Refill  . aspirin 81 MG tablet Take 81 mg by mouth daily.    .  citalopram (CELEXA) 20 MG tablet Take 20 mg by mouth at bedtime.    . Insulin Glargine 300 UNIT/ML SOPN Inject 80 Units into the skin at bedtime.    . insulin lispro protamine-lispro (HUMALOG 50/50 MIX) (50-50) 100 UNIT/ML SUSP injection Inject 4 Units into the skin 3 (three) times daily.    Marland Kitchen lisinopril (PRINIVIL,ZESTRIL) 20 MG tablet Take 20 mg by mouth every morning.    . metFORMIN (GLUCOPHAGE) 1000 MG tablet Take 1,000 mg by mouth 2 (two) times daily with a meal.    . vitamin B-12 (CYANOCOBALAMIN) 250 MCG tablet Take 250 mcg by mouth daily.    Marland Kitchen ibuprofen (ADVIL,MOTRIN) 800 MG tablet Take 1 tablet (800 mg total) by mouth 3 (three) times daily. (Patient not taking: Reported on 04/09/2015) 30 tablet 1  . oxyCODONE-acetaminophen (ROXICET) 5-325 MG per tablet Take 1-2 tablets by mouth every 4 (four) hours as needed for moderate pain or severe pain. (Patient not taking: Reported on 04/09/2015) 30 tablet 0   No current facility-administered medications on file prior to visit.    Allergies  Allergen Reactions  . Cephalosporins Itching    History   Social History  . Marital Status: Single    Spouse Name: N/A  . Number of Children: N/A  . Years of Education: N/A   Occupational History  . Not on file.   Social History Main Topics  . Smoking status: Never Smoker   .  Smokeless tobacco: Never Used  . Alcohol Use: No  . Drug Use: No  . Sexual Activity: Not on file   Other Topics Concern  . Not on file   Social History Narrative    Family History  Problem Relation Age of Onset  . Hypothyroidism Mother   . Breast cancer Paternal Aunt   . Breast cancer Other     maternal great aunt  . Colon cancer Maternal Grandfather   . Diabetes Other     maternal cousins, aunts and uncles    The following portions of the patient's history were reviewed and updated as appropriate: allergies, current medications, past family history, past medical history, past social history, past surgical history  and problem list.  Review of Systems Pertinent items are noted in HPI.   Objective:   BP 94/68 mmHg  Pulse 60  Ht 5\' 6"  (1.676 m)  Wt 230 lb 5 oz (104.469 kg)  BMI 37.19 kg/m2   Assessment/Plan:  There are no active problems to display for this patient.      Time: 15 minutes  Return to Clinic: Patient is to return for her annual exam as scheduled.   Ardell Isaacs, MD [facility]

## 2015-05-16 ENCOUNTER — Other Ambulatory Visit: Payer: Self-pay | Admitting: Internal Medicine

## 2015-05-16 DIAGNOSIS — Z1231 Encounter for screening mammogram for malignant neoplasm of breast: Secondary | ICD-10-CM

## 2015-05-21 ENCOUNTER — Ambulatory Visit
Admission: RE | Admit: 2015-05-21 | Discharge: 2015-05-21 | Disposition: A | Discharge status: Home / Self Care | Payer: BLUE CROSS/BLUE SHIELD | Source: Ambulatory Visit | Attending: Internal Medicine | Admitting: Internal Medicine

## 2015-05-21 DIAGNOSIS — Z1231 Encounter for screening mammogram for malignant neoplasm of breast: Secondary | ICD-10-CM | POA: Insufficient documentation

## 2015-05-26 ENCOUNTER — Other Ambulatory Visit: Payer: Self-pay | Admitting: Internal Medicine

## 2015-05-26 DIAGNOSIS — N6489 Other specified disorders of breast: Secondary | ICD-10-CM

## 2015-05-26 DIAGNOSIS — R928 Other abnormal and inconclusive findings on diagnostic imaging of breast: Secondary | ICD-10-CM

## 2015-05-28 ENCOUNTER — Ambulatory Visit
Admission: RE | Admit: 2015-05-28 | Discharge: 2015-05-28 | Disposition: A | Discharge status: Home / Self Care | Payer: BLUE CROSS/BLUE SHIELD | Source: Ambulatory Visit | Attending: Internal Medicine | Admitting: Internal Medicine

## 2015-05-28 DIAGNOSIS — N6489 Other specified disorders of breast: Secondary | ICD-10-CM | POA: Insufficient documentation

## 2015-05-28 DIAGNOSIS — R928 Other abnormal and inconclusive findings on diagnostic imaging of breast: Secondary | ICD-10-CM | POA: Diagnosis present

## 2015-06-06 ENCOUNTER — Other Ambulatory Visit: Payer: Self-pay | Admitting: Internal Medicine

## 2015-06-06 DIAGNOSIS — R928 Other abnormal and inconclusive findings on diagnostic imaging of breast: Secondary | ICD-10-CM

## 2015-06-10 ENCOUNTER — Encounter: Payer: Self-pay | Admitting: Obstetrics and Gynecology

## 2015-06-10 ENCOUNTER — Encounter: Payer: BLUE CROSS/BLUE SHIELD | Admitting: Obstetrics and Gynecology

## 2015-06-17 ENCOUNTER — Encounter: Payer: Self-pay | Admitting: Obstetrics and Gynecology

## 2015-06-19 ENCOUNTER — Ambulatory Visit: Payer: BLUE CROSS/BLUE SHIELD

## 2016-07-20 ENCOUNTER — Other Ambulatory Visit: Payer: Self-pay | Admitting: *Deleted

## 2016-07-20 ENCOUNTER — Inpatient Hospital Stay
Admission: RE | Admit: 2016-07-20 | Discharge: 2016-07-20 | Disposition: A | Discharge status: Home / Self Care | Payer: Self-pay | Source: Ambulatory Visit | Attending: *Deleted | Admitting: *Deleted

## 2016-07-20 DIAGNOSIS — Z9889 Other specified postprocedural states: Secondary | ICD-10-CM

## 2016-08-10 ENCOUNTER — Other Ambulatory Visit: Payer: Self-pay | Admitting: Internal Medicine

## 2016-08-10 DIAGNOSIS — Z1231 Encounter for screening mammogram for malignant neoplasm of breast: Secondary | ICD-10-CM

## 2016-09-06 ENCOUNTER — Ambulatory Visit: Payer: BLUE CROSS/BLUE SHIELD

## 2016-09-20 ENCOUNTER — Ambulatory Visit
Admission: RE | Admit: 2016-09-20 | Discharge: 2016-09-20 | Disposition: A | Discharge status: Home / Self Care | Payer: BLUE CROSS/BLUE SHIELD | Source: Ambulatory Visit | Attending: Internal Medicine | Admitting: Internal Medicine

## 2016-09-20 DIAGNOSIS — Z1231 Encounter for screening mammogram for malignant neoplasm of breast: Secondary | ICD-10-CM | POA: Diagnosis not present

## 2016-09-23 ENCOUNTER — Other Ambulatory Visit: Payer: Self-pay | Admitting: Internal Medicine

## 2016-09-23 DIAGNOSIS — N6310 Unspecified lump in the right breast, unspecified quadrant: Secondary | ICD-10-CM

## 2016-10-05 ENCOUNTER — Ambulatory Visit
Admission: RE | Admit: 2016-10-05 | Discharge: 2016-10-05 | Disposition: A | Discharge status: Home / Self Care | Payer: BLUE CROSS/BLUE SHIELD | Source: Ambulatory Visit | Attending: Internal Medicine | Admitting: Internal Medicine

## 2016-10-05 DIAGNOSIS — N6310 Unspecified lump in the right breast, unspecified quadrant: Secondary | ICD-10-CM

## 2016-10-05 DIAGNOSIS — N63 Unspecified lump in unspecified breast: Secondary | ICD-10-CM | POA: Diagnosis not present

## 2016-10-11 ENCOUNTER — Other Ambulatory Visit: Payer: Self-pay | Admitting: Internal Medicine

## 2016-10-11 DIAGNOSIS — N63 Unspecified lump in unspecified breast: Secondary | ICD-10-CM

## 2017-04-05 ENCOUNTER — Other Ambulatory Visit: Payer: BLUE CROSS/BLUE SHIELD

## 2017-04-05 ENCOUNTER — Ambulatory Visit: Payer: BLUE CROSS/BLUE SHIELD

## 2017-04-14 ENCOUNTER — Ambulatory Visit
Admission: RE | Admit: 2017-04-14 | Discharge: 2017-04-14 | Disposition: A | Discharge status: Home / Self Care | Payer: BLUE CROSS/BLUE SHIELD | Source: Ambulatory Visit | Attending: Internal Medicine | Admitting: Internal Medicine

## 2017-04-14 DIAGNOSIS — N6312 Unspecified lump in the right breast, upper inner quadrant: Secondary | ICD-10-CM | POA: Diagnosis present

## 2017-04-14 DIAGNOSIS — N63 Unspecified lump in unspecified breast: Secondary | ICD-10-CM

## 2018-01-10 ENCOUNTER — Other Ambulatory Visit: Payer: Self-pay | Admitting: Internal Medicine

## 2018-01-10 DIAGNOSIS — Z1231 Encounter for screening mammogram for malignant neoplasm of breast: Secondary | ICD-10-CM

## 2018-01-19 ENCOUNTER — Ambulatory Visit
Admission: RE | Admit: 2018-01-19 | Discharge: 2018-01-19 | Disposition: A | Discharge status: Home / Self Care | Payer: BLUE CROSS/BLUE SHIELD | Source: Ambulatory Visit | Attending: Internal Medicine | Admitting: Internal Medicine

## 2018-01-19 DIAGNOSIS — Z1231 Encounter for screening mammogram for malignant neoplasm of breast: Secondary | ICD-10-CM

## 2019-01-24 ENCOUNTER — Other Ambulatory Visit: Payer: Self-pay | Admitting: Internal Medicine

## 2019-01-24 DIAGNOSIS — Z1231 Encounter for screening mammogram for malignant neoplasm of breast: Secondary | ICD-10-CM

## 2019-03-15 ENCOUNTER — Telehealth: Payer: Self-pay | Admitting: Gastroenterology

## 2019-03-15 NOTE — Telephone Encounter (Signed)
Gastroenterology Pre-Procedure Review  Request Date: Jill Yu 05/15/2019   [facility] Requesting Physician: Dr. Allen Norris  PATIENT REVIEW QUESTIONS: The patient responded to the following health history questions as indicated:    1. Are you having any GI issues? no 2. Do you have a personal history of Polyps? yes (5 yrs ago) 3. Do you have a family history of Colon Cancer or Polyps? yes (Maternal Great Aunt) 4. Diabetes Mellitus? yes (Type II) 5. Joint replacements in the past 12 months?no 6. Major health problems in the past 3 months?no 7. Any artificial heart valves, MVP, or defibrillator?no    MEDICATIONS & ALLERGIES:    Patient reports the following regarding taking any anticoagulation/antiplatelet therapy:   Plavix, Coumadin, Eliquis, Xarelto, Lovenox, Pradaxa, Brilinta, or Effient? no Aspirin? yes (81 mg)  Patient confirms/reports the following medications:  Current Outpatient Medications  Medication Sig Dispense Refill  . aspirin 81 MG tablet Take 81 mg by mouth daily.    . citalopram (CELEXA) 20 MG tablet Take 20 mg by mouth at bedtime.    Marland Kitchen ibuprofen (ADVIL,MOTRIN) 800 MG tablet Take 1 tablet (800 mg total) by mouth 3 (three) times daily. (Patient not taking: Reported on 04/09/2015) 30 tablet 1  . Insulin Glargine 300 UNIT/ML SOPN Inject 80 Units into the skin at bedtime.    . insulin lispro protamine-lispro (HUMALOG 50/50 MIX) (50-50) 100 UNIT/ML SUSP injection Inject 4 Units into the skin 3 (three) times daily.    Marland Kitchen lisinopril (PRINIVIL,ZESTRIL) 20 MG tablet Take 20 mg by mouth every morning.    . metFORMIN (GLUCOPHAGE) 1000 MG tablet Take 1,000 mg by mouth 2 (two) times daily with a meal.    . oxyCODONE-acetaminophen (ROXICET) 5-325 MG per tablet Take 1-2 tablets by mouth every 4 (four) hours as needed for moderate pain or severe pain. (Patient not taking: Reported on 04/09/2015) 30 tablet 0  . vitamin B-12 (CYANOCOBALAMIN) 250 MCG tablet Take 250 mcg by mouth daily.     No current  facility-administered medications for this visit.     Patient confirms/reports the following allergies:  Allergies  Allergen Reactions  . Cephalosporins Itching    No orders of the defined types were placed in this encounter.   AUTHORIZATION INFORMATION Primary Insurance: 1D#: Group #:  Secondary Insurance: 1D#: Group #:  SCHEDULE INFORMATION: Date:  Time: Location:

## 2019-03-19 ENCOUNTER — Other Ambulatory Visit: Payer: Self-pay

## 2019-03-19 DIAGNOSIS — Z1211 Encounter for screening for malignant neoplasm of colon: Secondary | ICD-10-CM

## 2019-05-10 ENCOUNTER — Ambulatory Visit
Admission: RE | Admit: 2019-05-10 | Discharge: 2019-05-10 | Disposition: A | Discharge status: Home / Self Care | Payer: BC Managed Care – PPO | Source: Ambulatory Visit | Attending: Internal Medicine | Admitting: Internal Medicine

## 2019-05-10 ENCOUNTER — Other Ambulatory Visit: Payer: Self-pay

## 2019-05-10 ENCOUNTER — Telehealth: Payer: Self-pay | Admitting: Gastroenterology

## 2019-05-10 DIAGNOSIS — Z1231 Encounter for screening mammogram for malignant neoplasm of breast: Secondary | ICD-10-CM

## 2019-05-10 NOTE — Telephone Encounter (Signed)
Pt left vm she states she received a message she needed a Covid19 testing but the message did not state in details where she needed to go she would like a call

## 2019-05-10 NOTE — Telephone Encounter (Signed)
LVM for patient advising her to report to [place] on the campus of [facility] tomorrow July 3rd between the hours of 10:30am and 12:30pm for COVID testing in order to keep her colonoscopy date as scheduled for 05/15/19 at [facility] with Dr. Allen Norris.  Thanks [employer]

## 2019-05-11 ENCOUNTER — Other Ambulatory Visit
Admission: RE | Admit: 2019-05-11 | Discharge: 2019-05-11 | Disposition: A | Discharge status: Home / Self Care | Payer: BC Managed Care – PPO | Source: Ambulatory Visit | Attending: Gastroenterology | Admitting: Gastroenterology

## 2019-05-11 ENCOUNTER — Other Ambulatory Visit: Payer: Self-pay

## 2019-05-11 DIAGNOSIS — Z1159 Encounter for screening for other viral diseases: Secondary | ICD-10-CM | POA: Insufficient documentation

## 2019-05-11 DIAGNOSIS — Z01812 Encounter for preprocedural laboratory examination: Secondary | ICD-10-CM | POA: Diagnosis not present

## 2019-05-12 LAB — SARS CORONAVIRUS 2 (TAT 6-24 HRS): SARS Coronavirus 2: NEGATIVE

## 2019-05-14 ENCOUNTER — Encounter: Payer: Self-pay | Admitting: Anesthesiology

## 2019-05-15 ENCOUNTER — Ambulatory Visit: Payer: BC Managed Care – PPO | Admitting: Anesthesiology

## 2019-05-15 ENCOUNTER — Encounter
Admission: RE | Disposition: A | Discharge status: Home / Self Care | Payer: Self-pay | Source: Home / Self Care | Attending: Gastroenterology

## 2019-05-15 ENCOUNTER — Encounter: Payer: Self-pay | Admitting: Emergency Medicine

## 2019-05-15 ENCOUNTER — Ambulatory Visit
Admission: RE | Admit: 2019-05-15 | Discharge: 2019-05-15 | Disposition: A | Discharge status: Home / Self Care | Payer: BC Managed Care – PPO | Attending: Gastroenterology | Admitting: Gastroenterology

## 2019-05-15 ENCOUNTER — Other Ambulatory Visit: Payer: Self-pay

## 2019-05-15 DIAGNOSIS — Z1211 Encounter for screening for malignant neoplasm of colon: Secondary | ICD-10-CM

## 2019-05-15 DIAGNOSIS — Z7982 Long term (current) use of aspirin: Secondary | ICD-10-CM | POA: Diagnosis not present

## 2019-05-15 DIAGNOSIS — I10 Essential (primary) hypertension: Secondary | ICD-10-CM | POA: Insufficient documentation

## 2019-05-15 DIAGNOSIS — Z833 Family history of diabetes mellitus: Secondary | ICD-10-CM | POA: Insufficient documentation

## 2019-05-15 DIAGNOSIS — D125 Benign neoplasm of sigmoid colon: Secondary | ICD-10-CM | POA: Diagnosis not present

## 2019-05-15 DIAGNOSIS — Z881 Allergy status to other antibiotic agents status: Secondary | ICD-10-CM | POA: Diagnosis not present

## 2019-05-15 DIAGNOSIS — K621 Rectal polyp: Secondary | ICD-10-CM | POA: Diagnosis not present

## 2019-05-15 DIAGNOSIS — Z8349 Family history of other endocrine, nutritional and metabolic diseases: Secondary | ICD-10-CM | POA: Insufficient documentation

## 2019-05-15 DIAGNOSIS — G4730 Sleep apnea, unspecified: Secondary | ICD-10-CM | POA: Diagnosis not present

## 2019-05-15 DIAGNOSIS — Z794 Long term (current) use of insulin: Secondary | ICD-10-CM | POA: Diagnosis not present

## 2019-05-15 DIAGNOSIS — Z79899 Other long term (current) drug therapy: Secondary | ICD-10-CM | POA: Diagnosis not present

## 2019-05-15 DIAGNOSIS — E119 Type 2 diabetes mellitus without complications: Secondary | ICD-10-CM | POA: Diagnosis not present

## 2019-05-15 DIAGNOSIS — E039 Hypothyroidism, unspecified: Secondary | ICD-10-CM | POA: Insufficient documentation

## 2019-05-15 DIAGNOSIS — K635 Polyp of colon: Secondary | ICD-10-CM | POA: Diagnosis not present

## 2019-05-15 DIAGNOSIS — Z791 Long term (current) use of non-steroidal anti-inflammatories (NSAID): Secondary | ICD-10-CM | POA: Diagnosis not present

## 2019-05-15 DIAGNOSIS — Z860100 Personal history of colon polyps, unspecified: Secondary | ICD-10-CM

## 2019-05-15 DIAGNOSIS — D649 Anemia, unspecified: Secondary | ICD-10-CM | POA: Insufficient documentation

## 2019-05-15 DIAGNOSIS — Z800 Family history of malignant neoplasm of digestive organs: Secondary | ICD-10-CM | POA: Insufficient documentation

## 2019-05-15 DIAGNOSIS — E785 Hyperlipidemia, unspecified: Secondary | ICD-10-CM | POA: Insufficient documentation

## 2019-05-15 DIAGNOSIS — M1990 Unspecified osteoarthritis, unspecified site: Secondary | ICD-10-CM | POA: Diagnosis not present

## 2019-05-15 DIAGNOSIS — K760 Fatty (change of) liver, not elsewhere classified: Secondary | ICD-10-CM | POA: Insufficient documentation

## 2019-05-15 DIAGNOSIS — D120 Benign neoplasm of cecum: Secondary | ICD-10-CM | POA: Diagnosis not present

## 2019-05-15 DIAGNOSIS — Z803 Family history of malignant neoplasm of breast: Secondary | ICD-10-CM | POA: Diagnosis not present

## 2019-05-15 HISTORY — DX: Hypothyroidism, unspecified: E03.9

## 2019-05-15 HISTORY — PX: COLONOSCOPY WITH PROPOFOL: SHX5780

## 2019-05-15 LAB — GLUCOSE, CAPILLARY: Glucose-Capillary: 155 mg/dL — ABNORMAL HIGH (ref 70–99)

## 2019-05-15 SURGERY — COLONOSCOPY WITH PROPOFOL
Anesthesia: General

## 2019-05-15 MED ORDER — SODIUM CHLORIDE 0.9 % IV SOLN
INTRAVENOUS | Status: DC
  Administered 2019-05-15: 09:00:00 1000 mL via INTRAVENOUS

## 2019-05-15 MED ORDER — PROPOFOL 10 MG/ML IV BOLUS
INTRAVENOUS | Status: DC | PRN
  Administered 2019-05-15: 50 ug via INTRAVENOUS
  Administered 2019-05-15: 20 ug via INTRAVENOUS
  Administered 2019-05-15: 50 ug via INTRAVENOUS
  Administered 2019-05-15: 20 ug via INTRAVENOUS
  Administered 2019-05-15: 50 ug via INTRAVENOUS
  Administered 2019-05-15 (×2): 20 ug via INTRAVENOUS

## 2019-05-15 NOTE — Op Note (Signed)
[facility] Gastroenterology Patient Name: Jill Yu Procedure Date: 05/15/2019 9:11 AM MRN: [id number] Account #: [id number] Date of Birth: October 05, 1961 Admit Type: Outpatient Age: 58 Room: [facility] ENDO ROOM 4 Gender: Female Note Status: Finalized Procedure:            Colonoscopy Indications:          High risk colon cancer surveillance: Personal history                        of colonic polyps Providers:            Lucilla Lame MD, MD Referring MD:         Leonie Douglas. Doy Hutching, MD (Referring MD) Medicines:            Propofol per Anesthesia Complications:        No immediate complications. Procedure:            Pre-Anesthesia Assessment:                       - Prior to the procedure, a History and Physical was                        performed, and patient medications and allergies were                        reviewed. The patient's tolerance of previous                        anesthesia was also reviewed. The risks and benefits of                        the procedure and the sedation options and risks were                        discussed with the patient. All questions were                        answered, and informed consent was obtained. Prior                        Anticoagulants: The patient has taken no previous                        anticoagulant or antiplatelet agents. ASA Grade                        Assessment: II - A patient with mild systemic disease.                        After reviewing the risks and benefits, the patient was                        deemed in satisfactory condition to undergo the                        procedure.                       After obtaining informed consent, the colonoscope was  passed under direct vision. Throughout the procedure,                        the patient's blood pressure, pulse, and oxygen                        saturations were monitored continuously. The   Colonoscope was introduced through the anus and                        advanced to the the cecum, identified by appendiceal                        orifice and ileocecal valve. The colonoscopy was                        performed without difficulty. The patient tolerated the                        procedure well. The quality of the bowel preparation                        was excellent. Findings:      The perianal and digital rectal examinations were normal.      A 3 mm polyp was found in the cecum. The polyp was sessile. The polyp       was removed with a cold snare. Resection and retrieval were complete.      Three sessile polyps were found in the sigmoid colon. The polyps were 4       mm in size. These polyps were removed with a cold snare. Resection and       retrieval were complete.      A 4 mm polyp was found in the rectum. The polyp was sessile. The polyp       was removed with a cold snare. Resection and retrieval were complete. To       prevent bleeding post-intervention, one hemostatic clip was successfully       placed (MR conditional). Impression:           - One 3 mm polyp in the cecum, removed with a cold                        snare. Resected and retrieved.                       - Three 4 mm polyps in the sigmoid colon, removed with                        a cold snare. Resected and retrieved.                       - One 4 mm polyp in the rectum, removed with a cold                        snare. Resected and retrieved. Clip (MR conditional)                        was placed. Recommendation:       - Discharge patient to home.                       -  Resume previous diet.                       - Continue present medications. Procedure Code(s):    --- Professional ---                       [phone], Colonoscopy, flexible; with removal of tumor(s),                        polyp(s), or other lesion(s) by snare technique Diagnosis Code(s):    --- Professional ---                        Z86.010, Personal history of colonic polyps                       K63.5, Polyp of colon CPT copyright 2019 American Medical Association. All rights reserved. The codes documented in this report are preliminary and upon coder review may  be revised to meet current compliance requirements. Lucilla Lame MD, MD 05/15/2019 9:38:00 AM This report has been signed electronically. Number of Addenda: 0 Note Initiated On: 05/15/2019 9:11 AM Scope Withdrawal Time: 0 hours 9 minutes 26 seconds  Total Procedure Duration: 0 hours 15 minutes 38 seconds  Estimated Blood Loss: Estimated blood loss: none. Estimated blood loss: none.      [facility]

## 2019-05-15 NOTE — Anesthesia Postprocedure Evaluation (Signed)
Anesthesia Post Note  Patient: Jill Yu  Procedure(s) Performed: COLONOSCOPY WITH PROPOFOL (N/A )  Patient location during evaluation: Endoscopy Anesthesia Type: General Level of consciousness: awake and alert Pain management: pain level controlled Vital Signs Assessment: post-procedure vital signs reviewed and stable Respiratory status: spontaneous breathing, nonlabored ventilation, respiratory function stable and patient connected to nasal cannula oxygen Cardiovascular status: blood pressure returned to baseline and stable Postop Assessment: no apparent nausea or vomiting Anesthetic complications: no     Last Vitals:  Vitals:   05/15/19 0956 05/15/19 1006  BP: 109/64 (!) 113/49  Pulse: 61 60  Resp: 13 11  Temp:    SpO2: 99% 99%    Last Pain:  Vitals:   05/15/19 1006  TempSrc:   PainSc: 0-No pain                 Tayja Manzer S

## 2019-05-15 NOTE — Anesthesia Preprocedure Evaluation (Signed)
Anesthesia Evaluation  Patient identified by MRN, date of birth, ID band Patient awake    Reviewed: Allergy & Precautions, NPO status , Patient's Chart, lab work & pertinent test results, reviewed documented beta blocker date and time   Airway Mallampati: III  TM Distance: >3 FB     Dental  (+) Chipped   Pulmonary sleep apnea and Continuous Positive Airway Pressure Ventilation ,           Cardiovascular hypertension, Pt. on medications      Neuro/Psych    GI/Hepatic   Endo/Other  diabetes, Type 2Hypothyroidism   Renal/GU      Musculoskeletal   Abdominal   Peds  Hematology  (+) anemia ,   Anesthesia Other Findings EKG 4 yrs ok.  Reproductive/Obstetrics                             Anesthesia Physical Anesthesia Plan  ASA: III  Anesthesia Plan: General   Post-op Pain Management:    Induction: Intravenous  PONV Risk Score and Plan:   Airway Management Planned:   Additional Equipment:   Intra-op Plan:   Post-operative Plan:   Informed Consent: I have reviewed the patients History and Physical, chart, labs and discussed the procedure including the risks, benefits and alternatives for the proposed anesthesia with the patient or authorized representative who has indicated his/her understanding and acceptance.       Plan Discussed with: CRNA  Anesthesia Plan Comments:         Anesthesia Quick Evaluation

## 2019-05-15 NOTE — H&P (Signed)
Lucilla Lame, MD [address] [place], [place] [address] Phone:[phone] Fax : [phone]  Primary Care Physician:  Idelle Crouch, MD Primary Gastroenterologist:  Dr. Allen Norris  Pre-Procedure History & Physical: HPI:  Jill Yu is a 58 y.o. female is here for an colonoscopy.   Past Medical History:  Diagnosis Date  . Anemia   . Arthritis   . Diabetes mellitus without complication ([place])   . Endometrial hyperplasia    history of  . Endometrial polyp 03/31/2015  . Hyperlipidemia   . Hypertension   . Hypothyroidism   . Nonalcoholic fatty liver disease   . PMB (postmenopausal bleeding) 03/31/2015  . Polyp of colon   . Sleep apnea     Past Surgical History:  Procedure Laterality Date  . ANKLE FRACTURE SURGERY    . CARDIAC CATHETERIZATION  2015  . CYSTOSCOPY    . DILATION AND CURETTAGE OF UTERUS    . HYSTEROSCOPY W/D&C N/A 03/31/2015   Procedure: DILATATION AND CURETTAGE /HYSTEROSCOPY;  Surgeon: Brayton Mars, MD;  Location: [facility] ORS;  Service: Gynecology;  Laterality: N/A;  . Hysteroscopy/D&C, polypectomy    . MENISCECTOMY Left   . TONSILLECTOMY      Prior to Admission medications   Medication Sig Start Date End Date Taking? Authorizing Provider  aspirin 81 MG tablet Take 81 mg by mouth daily.   Yes [provider]  atorvastatin (LIPITOR) 20 MG tablet Take 20 mg by mouth daily.   Yes [provider]  ibuprofen (ADVIL,MOTRIN) 800 MG tablet Take 1 tablet (800 mg total) by mouth 3 (three) times daily. 03/31/15  Yes Defrancesco, Alanda Slim, MD  Insulin Glargine 300 UNIT/ML SOPN Inject 80 Units into the skin at bedtime.   Yes [provider]  insulin lispro protamine-lispro (HUMALOG 50/50 MIX) (50-50) 100 UNIT/ML SUSP injection Inject 4 Units into the skin 3 (three) times daily.   Yes [provider]  levothyroxine (SYNTHROID) 75 MCG tablet Take 75 mcg by mouth daily before breakfast.   Yes [provider]  lisinopril (PRINIVIL,ZESTRIL) 20 MG tablet Take 20 mg by mouth every morning.   Yes [provider]  metFORMIN (GLUCOPHAGE) 1000 MG tablet Take 1,000 mg by mouth 2 (two) times daily with a meal.   Yes [provider]  vitamin B-12 (CYANOCOBALAMIN) 250 MCG tablet Take 250 mcg by mouth daily.   Yes [provider]  citalopram (CELEXA) 20 MG tablet Take 20 mg by mouth at bedtime.    [provider]  oxyCODONE-acetaminophen (ROXICET) 5-325 MG per tablet Take 1-2 tablets by mouth every 4 (four) hours as needed for moderate pain or severe pain. Patient not taking: Reported on 04/09/2015 03/31/15   Defrancesco, Alanda Slim, MD    Allergies as of 03/19/2019 - Review Complete 04/09/2015  Allergen Reaction Noted  . Cephalosporins Itching 03/28/2015    Family History  Problem Relation Age of Onset  . Hypothyroidism Mother   . Breast cancer Paternal Aunt 36  . Breast cancer Other        maternal great aunt  . Colon cancer Maternal Grandfather   . Diabetes Other        maternal cousins, aunts and uncles    Social History   Socioeconomic History  . Marital status: Single    Spouse name: Not on file  . Number of children: Not on file  . Years of education: Not on file  . Highest education level: Not on file  Occupational History  . Not on file  Social Needs  . Financial resource strain: Not on file  . Food insecurity    Worry: Not on file    Inability: Not on file  . Transportation needs    Medical: Not on file    Non-medical: Not on file  Tobacco Use  . Smoking status: Never Smoker  . Smokeless tobacco: Never Used  Substance and Sexual Activity  . Alcohol use: No    Alcohol/week: 0.0 standard drinks  . Drug use: No  . Sexual activity: Not on file  Lifestyle  . Physical activity    Days per week: Not on file    Minutes per session: Not on file  . Stress: Not on file  Relationships  . Social Herbalist on phone: Not on file     Gets together: Not on file    Attends religious service: Not on file    Active member of club or organization: Not on file    Attends meetings of clubs or organizations: Not on file    Relationship status: Not on file  . Intimate partner violence    Fear of current or ex partner: Not on file    Emotionally abused: Not on file    Physically abused: Not on file    Forced sexual activity: Not on file  Other Topics Concern  . Not on file  Social History Narrative  . Not on file    Review of Systems: See HPI, otherwise negative ROS  Physical Exam: BP 140/67   Pulse 75   Temp (!) 96.4 F (35.8 C) (Tympanic)   Resp 18   Ht 5\' 5"  (1.651 m)   Wt 104.3 kg   SpO2 98%   BMI 38.27 kg/m  General:   Alert,  pleasant and cooperative in NAD Head:  Normocephalic and atraumatic. Neck:  Supple; no masses or thyromegaly. Lungs:  Clear throughout to auscultation.    Heart:  Regular rate and rhythm. Abdomen:  Soft, nontender and nondistended. Normal bowel sounds, without guarding, and without rebound.   Neurologic:  Alert and  oriented x4;  grossly normal neurologically.  Impression/Plan: Jill Yu is here for an colonoscopy to be performed for history of colon polyps 06/25/2014  Risks, benefits, limitations, and alternatives regarding  colonoscopy have been reviewed with the patient.  Questions have been answered.  All parties agreeable.   Lucilla Lame, MD  05/15/2019, 9:10 AM

## 2019-05-15 NOTE — Transfer of Care (Signed)
Immediate Anesthesia Transfer of Care Note  Patient: Jill Yu  Procedure(s) Performed: COLONOSCOPY WITH PROPOFOL (N/A )  Patient Location: Endoscopy Unit  Anesthesia Type:General  Level of Consciousness: awake and drowsy  Airway & Oxygen Therapy: Patient Spontanous Breathing and Patient connected to face mask oxygen  Post-op Assessment: Report given to RN and Post -op Vital signs reviewed and stable  Post vital signs: Reviewed and stable  Last Vitals:  Vitals Value Taken Time  BP    Temp    Pulse    Resp    SpO2      Last Pain:  Vitals:   05/15/19 0840  TempSrc: Tympanic  PainSc: 0-No pain         Complications: No apparent anesthesia complications

## 2019-05-15 NOTE — Anesthesia Post-op Follow-up Note (Signed)
Anesthesia QCDR form completed.        

## 2019-05-16 ENCOUNTER — Encounter: Payer: Self-pay | Admitting: Gastroenterology

## 2019-05-16 LAB — SURGICAL PATHOLOGY

## 2019-05-17 ENCOUNTER — Encounter: Payer: Self-pay | Admitting: Gastroenterology

## 2020-03-06 ENCOUNTER — Other Ambulatory Visit: Payer: Self-pay

## 2020-03-06 ENCOUNTER — Other Ambulatory Visit (HOSPITAL_COMMUNITY): Payer: Self-pay | Admitting: Orthopedic Surgery

## 2020-03-06 ENCOUNTER — Ambulatory Visit (HOSPITAL_COMMUNITY)
Admission: RE | Admit: 2020-03-06 | Discharge: 2020-03-06 | Disposition: A | Discharge status: Home / Self Care | Payer: BC Managed Care – PPO | Source: Ambulatory Visit | Attending: Orthopedic Surgery | Admitting: Orthopedic Surgery

## 2020-03-06 DIAGNOSIS — M79605 Pain in left leg: Secondary | ICD-10-CM | POA: Insufficient documentation

## 2020-03-06 DIAGNOSIS — M79604 Pain in right leg: Secondary | ICD-10-CM

## 2020-03-06 DIAGNOSIS — M7989 Other specified soft tissue disorders: Secondary | ICD-10-CM | POA: Diagnosis not present

## 2020-03-06 NOTE — Progress Notes (Signed)
Left lower extremity venous duplex completed. Refer to "CV Proc" under chart review to view preliminary results.  03/06/2020 1:44 PM Kelby Aline., MHA, RVT, RDCS, RDMS

## 2020-05-29 ENCOUNTER — Other Ambulatory Visit: Payer: Self-pay | Admitting: Internal Medicine

## 2020-05-29 DIAGNOSIS — Z1231 Encounter for screening mammogram for malignant neoplasm of breast: Secondary | ICD-10-CM

## 2020-06-17 ENCOUNTER — Other Ambulatory Visit: Payer: Self-pay

## 2020-06-17 ENCOUNTER — Ambulatory Visit
Admission: RE | Admit: 2020-06-17 | Discharge: 2020-06-17 | Disposition: A | Discharge status: Home / Self Care | Payer: BC Managed Care – PPO | Source: Ambulatory Visit | Attending: Internal Medicine | Admitting: Internal Medicine

## 2020-06-17 DIAGNOSIS — Z1231 Encounter for screening mammogram for malignant neoplasm of breast: Secondary | ICD-10-CM | POA: Insufficient documentation

## 2021-02-11 ENCOUNTER — Other Ambulatory Visit: Payer: Self-pay | Admitting: Internal Medicine

## 2021-02-11 DIAGNOSIS — Z1231 Encounter for screening mammogram for malignant neoplasm of breast: Secondary | ICD-10-CM

## 2021-06-23 ENCOUNTER — Other Ambulatory Visit: Payer: Self-pay

## 2021-06-23 ENCOUNTER — Ambulatory Visit
Admission: RE | Admit: 2021-06-23 | Discharge: 2021-06-23 | Disposition: A | Discharge status: Home / Self Care | Payer: BC Managed Care – PPO | Source: Ambulatory Visit | Attending: Internal Medicine | Admitting: Internal Medicine

## 2021-06-23 DIAGNOSIS — Z1231 Encounter for screening mammogram for malignant neoplasm of breast: Secondary | ICD-10-CM | POA: Diagnosis present

## 2022-02-19 ENCOUNTER — Other Ambulatory Visit: Payer: Self-pay | Admitting: Internal Medicine

## 2022-02-19 DIAGNOSIS — Z1231 Encounter for screening mammogram for malignant neoplasm of breast: Secondary | ICD-10-CM

## 2022-06-24 ENCOUNTER — Ambulatory Visit
Admission: RE | Admit: 2022-06-24 | Discharge: 2022-06-24 | Disposition: A | Discharge status: Home / Self Care | Payer: BC Managed Care – PPO | Source: Ambulatory Visit | Attending: Internal Medicine | Admitting: Internal Medicine

## 2022-06-24 DIAGNOSIS — Z1231 Encounter for screening mammogram for malignant neoplasm of breast: Secondary | ICD-10-CM | POA: Diagnosis present

## 2022-06-29 ENCOUNTER — Other Ambulatory Visit: Payer: Self-pay | Admitting: Internal Medicine

## 2022-06-29 DIAGNOSIS — N63 Unspecified lump in unspecified breast: Secondary | ICD-10-CM

## 2022-06-29 DIAGNOSIS — R928 Other abnormal and inconclusive findings on diagnostic imaging of breast: Secondary | ICD-10-CM

## 2022-07-15 ENCOUNTER — Ambulatory Visit
Admission: RE | Admit: 2022-07-15 | Discharge: 2022-07-15 | Disposition: A | Discharge status: Home / Self Care | Payer: BC Managed Care – PPO | Source: Ambulatory Visit | Attending: Internal Medicine | Admitting: Internal Medicine

## 2022-07-15 DIAGNOSIS — N63 Unspecified lump in unspecified breast: Secondary | ICD-10-CM

## 2022-07-15 DIAGNOSIS — R928 Other abnormal and inconclusive findings on diagnostic imaging of breast: Secondary | ICD-10-CM

## 2022-07-19 ENCOUNTER — Other Ambulatory Visit: Payer: Self-pay | Admitting: Internal Medicine

## 2022-07-19 DIAGNOSIS — R928 Other abnormal and inconclusive findings on diagnostic imaging of breast: Secondary | ICD-10-CM

## 2022-07-19 DIAGNOSIS — N63 Unspecified lump in unspecified breast: Secondary | ICD-10-CM

## 2022-08-03 ENCOUNTER — Ambulatory Visit
Admission: RE | Admit: 2022-08-03 | Discharge: 2022-08-03 | Disposition: A | Discharge status: Home / Self Care | Payer: BC Managed Care – PPO | Source: Ambulatory Visit | Attending: Internal Medicine | Admitting: Internal Medicine

## 2022-08-03 DIAGNOSIS — R928 Other abnormal and inconclusive findings on diagnostic imaging of breast: Secondary | ICD-10-CM | POA: Insufficient documentation

## 2022-08-03 DIAGNOSIS — N63 Unspecified lump in unspecified breast: Secondary | ICD-10-CM | POA: Insufficient documentation

## 2022-08-03 HISTORY — PX: BREAST BIOPSY: SHX20

## 2022-08-04 LAB — SURGICAL PATHOLOGY

## 2022-08-06 ENCOUNTER — Encounter: Payer: Self-pay | Admitting: *Deleted

## 2022-08-06 NOTE — Progress Notes (Signed)
Referral recieved from [facility] for benign breast mass.  She is not feeling well today and would like me to reach out to her tomorrow to figure out who she would like to see for a surgical consult.

## 2022-08-12 ENCOUNTER — Encounter: Payer: Self-pay | Admitting: *Deleted

## 2022-08-12 NOTE — Progress Notes (Signed)
Jill Yu has been scheduled with Dr. Peyton Najjar on 10/17 at 3:30pm.   She is aware of appt. Details.

## 2022-08-24 ENCOUNTER — Ambulatory Visit: Payer: Self-pay | Admitting: General Surgery

## 2022-08-24 ENCOUNTER — Other Ambulatory Visit: Payer: Self-pay | Admitting: General Surgery

## 2022-08-24 DIAGNOSIS — N6091 Unspecified benign mammary dysplasia of right breast: Secondary | ICD-10-CM

## 2022-08-24 NOTE — H&P (Signed)
PATIENT PROFILE: Jill Yu is a 61 y.o. female who presents to the Clinic for consultation at the request of Dr. Doy Hutching for evaluation of atypical ductal hyperplasia of the right breast.  PCP:  Idelle Crouch, MD  HISTORY OF PRESENT ILLNESS: Ms. Jill Yu reports she had her usual screening mammogram.  There was a concerning mass identified in the right breast.  This led to diagnostic mammogram and ultrasound.  Images confirm a 4 mm mass on the 12 o'clock position of the right breast.  Core needle biopsy was done showing atypical ductal hyperplasia with small area concerning for DCIS.  Excisional biopsy was recommended.  Patient denies any palpable masses, skin changes, nipple discharge.  Family history of breast cancer: Paternal aunt Family history of other cancers: None Menarche: 89 Menopause: 44s Used OCP: Few years in her 60s Used estrogen and progesterone therapy: Denies History of Radiation to the chest: None Number of pregnancies: 0 Previous breast biopsy: 0  PROBLEM LIST: Problem List  Date Reviewed: 08/24/2022          Noted   Personal history of colonic polyps 09/17/2021   Acquired hypothyroidism 01/30/2019   Female hirsutism 09/20/2016   Overview    Since 2015, after tx with progesterone for endometrial hyperplasia.  Ordered androgen labs 09/20/16.  Pending.       Type II diabetes mellitus with complication (CMS-HCC) Unknown   Polycystic ovarian disease Unknown   Colon polyps Unknown   Hyperlipidemia Unknown   HNP (herniated nucleus pulposus) Unknown   Overview    lumbar spine       Bilateral carpal tunnel syndrome Unknown   Osteoarthritis of right knee Unknown   Hypertension Unknown   Sleep apnea Unknown   Overview    Obstructive sleep apnea, now on CPAP.        Non-cardiac chest pain Unknown   Overview    w/ negative cardiac catheterization        GENERAL REVIEW OF SYSTEMS:   General ROS: negative for - chills, fatigue, fever,  weight gain or weight loss Allergy and Immunology ROS: negative for - hives  Hematological and Lymphatic ROS: negative for - bleeding problems or bruising, negative for palpable nodes Endocrine ROS: negative for - heat or cold intolerance, hair changes Respiratory ROS: negative for - cough, shortness of breath or wheezing Cardiovascular ROS: no chest pain or palpitations GI ROS: negative for nausea, vomiting, abdominal pain, diarrhea, constipation Musculoskeletal ROS: negative for - joint swelling or muscle pain Neurological ROS: negative for - confusion, syncope Dermatological ROS: negative for pruritus and rash Psychiatric: negative for anxiety, depression, difficulty sleeping and memory loss  MEDICATIONS: Current Outpatient Medications  Medication Sig Dispense Refill   ALPRAZolam (XANAX) 0.25 MG tablet 1-2 tablets every 8 hours as needed 30 tablet 2   aspirin 81 MG EC tablet Take 81 mg by mouth once daily.     atorvastatin (LIPITOR) 20 MG tablet Take 1 tablet (20 mg total) by mouth once daily 90 tablet 3   blood glucose diagnostic (ONETOUCH VERIO TEST STRIPS) test strip 5 (five) times daily Use 1 strip via meter four times daily. 450 strip 3   cyanocobalamin, vitamin B-12, 1,000 mcg/15 mL Liqd Take 1,000 mcg by mouth once daily       doxycycline (VIBRAMYCIN) 100 MG capsule Take 1 capsule (100 mg total) by mouth 2 (two) times daily for 7 days 14 capsule 0   HUMALOG KWIKPEN INSULIN 200 unit/mL (3 mL) InPn INJECT 25 UNITS  SUBCUTANEOUSLY 3 (THREE) TIMES DAILY 12 Syringe 32   insulin glargine U-300 conc (TOUJEO MAX U-300 SOLOSTAR) 300 unit/mL (3 mL) InPn Inject 100 Units subcutaneously once daily 30 mL 3   levothyroxine (SYNTHROID) 75 MCG tablet Take 1 tablet (75 mcg total) by mouth once daily for 90 days Take as directed 90 tablet 3   lisinopriL (ZESTRIL) 10 MG tablet Take 1 tablet (10 mg total) by mouth 2 (two) times daily 180 tablet 3   metFORMIN (GLUCOPHAGE) 1000 MG tablet Take 1 tablet  (1,000 mg total) by mouth 2 (two) times daily 180 tablet 3   multivitamin capsule Take 1 capsule by mouth once daily.     pen needle, diabetic (BD NANO 2ND GEN PEN NEEDLE) 32 gauge x 5/32" Ndle Inject 1 each subcutaneously 4 (four) times daily 360 each 3   No current facility-administered medications for this visit.    ALLERGIES: Cephalosporins and Sulfa (sulfonamide antibiotics)  PAST MEDICAL HISTORY: Past Medical History:  Diagnosis Date   Abnormal cytology    Allergic state    Cephalosporins and sulfa drugs   Bilateral carpal tunnel syndrome    Carpal tunnel syndrome    Colon polyps    Depression    Diabetes mellitus type 2, uncomplicated (CMS-HCC)    Endometrial hyperplasia without atypia, simple 03/2015   Used provera   Endometrial polyp 03/2015   Heart murmur    Functional mumur   History of chicken pox    HNP (herniated nucleus pulposus)    lumbar spine   Hyperlipidemia    Hypertension    Increased BMI    Kidney lesion    Non-alcoholic fatty liver disease    Non-cardiac chest pain    w/ negative cardiac catheterization   Osteoarthritis of right knee    PMB (postmenopausal bleeding) 03/2015   Polycystic ovarian disease    Sleep apnea    Obstructive sleep apnea, now on CPAP.    Stress incontinence in female    Thyroid disease    Hypothyroidsim    PAST SURGICAL HISTORY: Past Surgical History:  Procedure Laterality Date   TONSILLECTOMY  1965   T&A   [place]   for frequent UTIs.   FRACTURE SURGERY Right 10/2004   s/p right ankle fracture reconstructive repair   menisectomy Left 2007   KNEE ARTHROSCOPY Left 06/28/2008   partial medial meniscectomy, chondroplasty patella, chondroplasty medial femoral condyle, anterior compartment synovectomy   ENDOMETRIAL BIOPSY  02/2012   cardiac catherization  2015   negative   COLONOSCOPY  06/25/2014   Repeat 5 yrs---Normal--[place]   COLONOSCOPY  05/2019   rpt 5 yrs   DILATION AND CURETTAGE OF UTERUS     X2  due to uterine polyps     FAMILY HISTORY: Family History  Problem Relation Age of Onset   Thyroid disease Mother        HYPOTHYROIDISM   Diabetes type II Mother    Breast cancer Mother    Colon polyps Mother    High blood pressure (Hypertension) Mother    Osteoarthritis Mother    No Known Problems Brother    Breast cancer Other    Arthritis Other      SOCIAL HISTORY: Social History   Socioeconomic History   Marital status: Single  Tobacco Use   Smoking status: Never   Smokeless tobacco: Never  Vaping Use   Vaping Use: Never used  Substance and Sexual Activity   Alcohol use: No   Drug use: No  Sexual activity: Never    Partners: Male    Birth control/protection: Post-menopausal    PHYSICAL EXAM: Vitals:   08/24/22 1506  BP: 134/70  Pulse: 81   Body mass index is 36.94 kg/m. Weight: 100.7 kg (222 lb)   GENERAL: Alert, active, oriented x3  HEENT: Pupils equal reactive to light. Extraocular movements are intact. Sclera clear. Palpebral conjunctiva normal red color.Pharynx clear.  NECK: Supple with no palpable mass and no adenopathy.  LUNGS: Sound clear with no rales rhonchi or wheezes.  HEART: Regular rhythm S1 and S2 without murmur.  BREAST: breasts appear normal, no suspicious masses, no skin or nipple changes or axillary nodes.  ABDOMEN: Soft and depressible, nontender with no palpable mass, no hepatomegaly.  EXTREMITIES: Well-developed well-nourished symmetrical with no dependent edema.  NEUROLOGICAL: Awake alert oriented, facial expression symmetrical, moving all extremities.  REVIEW OF DATA: I have reviewed the following data today: Appointment on 08/17/2022  Component Date Value   WBC (White Blood Cell Co* 08/17/2022 6.7    RBC (Red Blood Cell Coun* 08/17/2022 4.13    Hemoglobin 08/17/2022 12.1    Hematocrit 08/17/2022 35.9    MCV (Mean Corpuscular Vo* 08/17/2022 86.9    MCH (Mean Corpuscular He* 08/17/2022 29.3    MCHC (Mean Corpuscular  H* 08/17/2022 33.7    Platelet Count 08/17/2022 236    RDW-CV (Red Cell Distrib* 08/17/2022 13.9    MPV (Mean Platelet Volum* 08/17/2022 8.9 (L)    Neutrophils 08/17/2022 4.13    Lymphocytes 08/17/2022 1.66    Monocytes 08/17/2022 0.57    Eosinophils 08/17/2022 0.27    Basophils 08/17/2022 0.07    Neutrophil % 08/17/2022 61.5    Lymphocyte % 08/17/2022 24.7    Monocyte % 08/17/2022 8.5    Eosinophil % 08/17/2022 4.0    Basophil% 08/17/2022 1.0    Immature Granulocyte % 08/17/2022 0.3    Immature Granulocyte Cou* 08/17/2022 0.02    Cholesterol, Total 08/17/2022 142    Triglyceride 08/17/2022 96    HDL (High Density Lipopr* 08/17/2022 39.0    LDL Calculated 08/17/2022 84    VLDL Cholesterol 08/17/2022 19    Cholesterol/HDL Ratio 08/17/2022 3.6    Glucose 08/17/2022 82    Sodium 08/17/2022 140    Potassium 08/17/2022 4.3    Chloride 08/17/2022 105    Carbon Dioxide (CO2) 08/17/2022 29.4    Urea Nitrogen (BUN) 08/17/2022 13    Creatinine 08/17/2022 1.2 (H)    Glomerular Filtration Ra* 08/17/2022 52 (L)    Calcium 08/17/2022 8.9    AST  08/17/2022 13    ALT  08/17/2022 22    Alk Phos (alkaline Phosp* 08/17/2022 72    Albumin 08/17/2022 4.1    Bilirubin, Total 08/17/2022 0.9    Protein, Total 08/17/2022 6.2    A/G Ratio 08/17/2022 2.0    Thyroid Stimulating Horm* 08/17/2022 1.729    Color 08/17/2022 Colorless    Clarity 08/17/2022 Clear    Specific Gravity 08/17/2022 1.007    pH, Urine 08/17/2022 5.0    Protein, Urinalysis 08/17/2022 Negative    Glucose, Urinalysis 08/17/2022 Negative    Ketones, Urinalysis 08/17/2022 Negative    Blood, Urinalysis 08/17/2022 Negative    Nitrite, Urinalysis 08/17/2022 Negative    Leukocyte Esterase, Urin* 08/17/2022 Negative    Bilirubin, Urinalysis 08/17/2022 Negative    Urobilinogen, Urinalysis 08/17/2022 0.2    WBC, UA 08/17/2022 9 (H)    Red Blood Cells, Urinaly* 08/17/2022 0    Bacteria, Urinalysis 08/17/2022 >50 Marland Kitchen)  Squamous  Epithelial Cell* 08/17/2022 0    Hemoglobin A1C 08/17/2022 6.5 (H)    Average Blood Glucose (C* 08/17/2022 140   Office Visit on 08/05/2022  Component Date Value   ID Now COVID-19 - [facility]* 08/05/2022 Positive (!)      ASSESSMENT: Ms. Veldman is a 61 y.o. female presenting for consultation for atypical ductal hyperplasia.    Patient was oriented again about the pathology results. Surgical alternatives were discussed with patient including excisional biopsy of the right breast. Surgical technique and post operative care was discussed with patient. Risk of surgery was discussed with patient including but not limited to: wound infection, seroma, hematoma, brachial plexopathy, mondor's disease (thrombosis of small veins of breast), chronic wound pain, breast lymphedema, altered sensation to the nipple and cosmesis among others.   Patient was oriented about the implication of having atypical ductal hyperplasia.  She was oriented about the goal of the surgery that is diagnostic, not therapeutic.  Discussed with patient the low probability of upgrading to ductal carcinoma in situ.  Also discussed with patient the high risk of breast cancer due to atypical hyperplasia.  Discussed with patient outpatient referral to medical oncology after the excisional biopsy results to discuss chemoprevention of risk of breast cancer alternatives.  The patient reports she understood the plan and agreed to proceed.  Atypical ductal hyperplasia of right breast [N60.91]  PLAN: 1.  Right breast radiofrequency tag excisional biopsy (19125) 2.  CBC, CMP done 3.  Hold aspirin 5 days before the procedure 4.  Contact us if you have any concern  Patient verbalized understanding, all questions were answered, and were agreeable with the plan outlined above.     Herbert Pun, MD  Electronically signed by Herbert Pun, MD

## 2022-08-24 NOTE — H&P (View-Only) (Signed)
PATIENT PROFILE: Jill Yu is a 61 y.o. female who presents to the Clinic for consultation at the request of Dr. Doy Yu for evaluation of atypical ductal hyperplasia of the right breast.  PCP:  Jill Crouch, MD  HISTORY OF PRESENT ILLNESS: Jill Yu reports she had her usual screening mammogram.  There was a concerning mass identified in the right breast.  This led to diagnostic mammogram and ultrasound.  Images confirm a 4 mm mass on the 12 o'clock position of the right breast.  Core needle biopsy was done showing atypical ductal hyperplasia with small area concerning for DCIS.  Excisional biopsy was recommended.  Patient denies any palpable masses, skin changes, nipple discharge.  Family history of breast cancer: Paternal aunt Family history of other cancers: None Menarche: 89 Menopause: 44s Used OCP: Few years in her 60s Used estrogen and progesterone therapy: Denies History of Radiation to the chest: None Number of pregnancies: 0 Previous breast biopsy: 0  PROBLEM LIST: Problem List  Date Reviewed: 08/24/2022          Noted   Personal history of colonic polyps 09/17/2021   Acquired hypothyroidism 01/30/2019   Female hirsutism 09/20/2016   Overview    Since 2015, after tx with progesterone for endometrial hyperplasia.  Ordered androgen labs 09/20/16.  Pending.       Type II diabetes mellitus with complication (CMS-HCC) Unknown   Polycystic ovarian disease Unknown   Colon polyps Unknown   Hyperlipidemia Unknown   HNP (herniated nucleus pulposus) Unknown   Overview    lumbar spine       Bilateral carpal tunnel syndrome Unknown   Osteoarthritis of right knee Unknown   Hypertension Unknown   Sleep apnea Unknown   Overview    Obstructive sleep apnea, now on CPAP.        Non-cardiac chest pain Unknown   Overview    w/ negative cardiac catheterization        GENERAL REVIEW OF SYSTEMS:   General ROS: negative for - chills, fatigue, fever,  weight gain or weight loss Allergy and Immunology ROS: negative for - hives  Hematological and Lymphatic ROS: negative for - bleeding problems or bruising, negative for palpable nodes Endocrine ROS: negative for - heat or cold intolerance, hair changes Respiratory ROS: negative for - cough, shortness of breath or wheezing Cardiovascular ROS: no chest pain or palpitations GI ROS: negative for nausea, vomiting, abdominal pain, diarrhea, constipation Musculoskeletal ROS: negative for - joint swelling or muscle pain Neurological ROS: negative for - confusion, syncope Dermatological ROS: negative for pruritus and rash Psychiatric: negative for anxiety, depression, difficulty sleeping and memory loss  MEDICATIONS: Current Outpatient Medications  Medication Sig Dispense Refill   ALPRAZolam (XANAX) 0.25 MG tablet 1-2 tablets every 8 hours as needed 30 tablet 2   aspirin 81 MG EC tablet Take 81 mg by mouth once daily.     atorvastatin (LIPITOR) 20 MG tablet Take 1 tablet (20 mg total) by mouth once daily 90 tablet 3   blood glucose diagnostic (ONETOUCH VERIO TEST STRIPS) test strip 5 (five) times daily Use 1 strip via meter four times daily. 450 strip 3   cyanocobalamin, vitamin B-12, 1,000 mcg/15 mL Liqd Take 1,000 mcg by mouth once daily       doxycycline (VIBRAMYCIN) 100 MG capsule Take 1 capsule (100 mg total) by mouth 2 (two) times daily for 7 days 14 capsule 0   HUMALOG KWIKPEN INSULIN 200 unit/mL (3 mL) InPn INJECT 25 UNITS  SUBCUTANEOUSLY 3 (THREE) TIMES DAILY 12 Syringe 32   insulin glargine U-300 conc (TOUJEO MAX U-300 SOLOSTAR) 300 unit/mL (3 mL) InPn Inject 100 Units subcutaneously once daily 30 mL 3   levothyroxine (SYNTHROID) 75 MCG tablet Take 1 tablet (75 mcg total) by mouth once daily for 90 days Take as directed 90 tablet 3   lisinopriL (ZESTRIL) 10 MG tablet Take 1 tablet (10 mg total) by mouth 2 (two) times daily 180 tablet 3   metFORMIN (GLUCOPHAGE) 1000 MG tablet Take 1 tablet  (1,000 mg total) by mouth 2 (two) times daily 180 tablet 3   multivitamin capsule Take 1 capsule by mouth once daily.     pen needle, diabetic (BD NANO 2ND GEN PEN NEEDLE) 32 gauge x 5/32" Ndle Inject 1 each subcutaneously 4 (four) times daily 360 each 3   No current facility-administered medications for this visit.    ALLERGIES: Cephalosporins and Sulfa (sulfonamide antibiotics)  PAST MEDICAL HISTORY: Past Medical History:  Diagnosis Date   Abnormal cytology    Allergic state    Cephalosporins and sulfa drugs   Bilateral carpal tunnel syndrome    Carpal tunnel syndrome    Colon polyps    Depression    Diabetes mellitus type 2, uncomplicated (CMS-HCC)    Endometrial hyperplasia without atypia, simple 03/2015   Used provera   Endometrial polyp 03/2015   Heart murmur    Functional mumur   History of chicken pox    HNP (herniated nucleus pulposus)    lumbar spine   Hyperlipidemia    Hypertension    Increased BMI    Kidney lesion    Non-alcoholic fatty liver disease    Non-cardiac chest pain    w/ negative cardiac catheterization   Osteoarthritis of right knee    PMB (postmenopausal bleeding) 03/2015   Polycystic ovarian disease    Sleep apnea    Obstructive sleep apnea, now on CPAP.    Stress incontinence in female    Thyroid disease    Hypothyroidsim    PAST SURGICAL HISTORY: Past Surgical History:  Procedure Laterality Date   TONSILLECTOMY  1965   T&A   [place]   for frequent UTIs.   FRACTURE SURGERY Right 10/2004   s/p right ankle fracture reconstructive repair   menisectomy Left 2007   KNEE ARTHROSCOPY Left 06/28/2008   partial medial meniscectomy, chondroplasty patella, chondroplasty medial femoral condyle, anterior compartment synovectomy   ENDOMETRIAL BIOPSY  02/2012   cardiac catherization  2015   negative   COLONOSCOPY  06/25/2014   Repeat 5 yrs---Normal--[place]   COLONOSCOPY  05/2019   rpt 5 yrs   DILATION AND CURETTAGE OF UTERUS     X2  due to uterine polyps     FAMILY HISTORY: Family History  Problem Relation Age of Onset   Thyroid disease Mother        HYPOTHYROIDISM   Diabetes type II Mother    Breast cancer Mother    Colon polyps Mother    High blood pressure (Hypertension) Mother    Osteoarthritis Mother    No Known Problems Brother    Breast cancer Other    Arthritis Other      SOCIAL HISTORY: Social History   Socioeconomic History   Marital status: Single  Tobacco Use   Smoking status: Never   Smokeless tobacco: Never  Vaping Use   Vaping Use: Never used  Substance and Sexual Activity   Alcohol use: No   Drug use: No  Sexual activity: Never    Partners: Male    Birth control/protection: Post-menopausal    PHYSICAL EXAM: Vitals:   08/24/22 1506  BP: 134/70  Pulse: 81   Body mass index is 36.94 kg/m. Weight: 100.7 kg (222 lb)   GENERAL: Alert, active, oriented x3  HEENT: Pupils equal reactive to light. Extraocular movements are intact. Sclera clear. Palpebral conjunctiva normal red color.Pharynx clear.  NECK: Supple with no palpable mass and no adenopathy.  LUNGS: Sound clear with no rales rhonchi or wheezes.  HEART: Regular rhythm S1 and S2 without murmur.  BREAST: breasts appear normal, no suspicious masses, no skin or nipple changes or axillary nodes.  ABDOMEN: Soft and depressible, nontender with no palpable mass, no hepatomegaly.  EXTREMITIES: Well-developed well-nourished symmetrical with no dependent edema.  NEUROLOGICAL: Awake alert oriented, facial expression symmetrical, moving all extremities.  REVIEW OF DATA: I have reviewed the following data today: Appointment on 08/17/2022  Component Date Value   WBC (White Blood Cell Co* 08/17/2022 6.7    RBC (Red Blood Cell Coun* 08/17/2022 4.13    Hemoglobin 08/17/2022 12.1    Hematocrit 08/17/2022 35.9    MCV (Mean Corpuscular Vo* 08/17/2022 86.9    MCH (Mean Corpuscular He* 08/17/2022 29.3    MCHC (Mean Corpuscular  H* 08/17/2022 33.7    Platelet Count 08/17/2022 236    RDW-CV (Red Cell Distrib* 08/17/2022 13.9    MPV (Mean Platelet Volum* 08/17/2022 8.9 (L)    Neutrophils 08/17/2022 4.13    Lymphocytes 08/17/2022 1.66    Monocytes 08/17/2022 0.57    Eosinophils 08/17/2022 0.27    Basophils 08/17/2022 0.07    Neutrophil % 08/17/2022 61.5    Lymphocyte % 08/17/2022 24.7    Monocyte % 08/17/2022 8.5    Eosinophil % 08/17/2022 4.0    Basophil% 08/17/2022 1.0    Immature Granulocyte % 08/17/2022 0.3    Immature Granulocyte Cou* 08/17/2022 0.02    Cholesterol, Total 08/17/2022 142    Triglyceride 08/17/2022 96    HDL (High Density Lipopr* 08/17/2022 39.0    LDL Calculated 08/17/2022 84    VLDL Cholesterol 08/17/2022 19    Cholesterol/HDL Ratio 08/17/2022 3.6    Glucose 08/17/2022 82    Sodium 08/17/2022 140    Potassium 08/17/2022 4.3    Chloride 08/17/2022 105    Carbon Dioxide (CO2) 08/17/2022 29.4    Urea Nitrogen (BUN) 08/17/2022 13    Creatinine 08/17/2022 1.2 (H)    Glomerular Filtration Ra* 08/17/2022 52 (L)    Calcium 08/17/2022 8.9    AST  08/17/2022 13    ALT  08/17/2022 22    Alk Phos (alkaline Phosp* 08/17/2022 72    Albumin 08/17/2022 4.1    Bilirubin, Total 08/17/2022 0.9    Protein, Total 08/17/2022 6.2    A/G Ratio 08/17/2022 2.0    Thyroid Stimulating Horm* 08/17/2022 1.729    Color 08/17/2022 Colorless    Clarity 08/17/2022 Clear    Specific Gravity 08/17/2022 1.007    pH, Urine 08/17/2022 5.0    Protein, Urinalysis 08/17/2022 Negative    Glucose, Urinalysis 08/17/2022 Negative    Ketones, Urinalysis 08/17/2022 Negative    Blood, Urinalysis 08/17/2022 Negative    Nitrite, Urinalysis 08/17/2022 Negative    Leukocyte Esterase, Urin* 08/17/2022 Negative    Bilirubin, Urinalysis 08/17/2022 Negative    Urobilinogen, Urinalysis 08/17/2022 0.2    WBC, UA 08/17/2022 9 (H)    Red Blood Cells, Urinaly* 08/17/2022 0    Bacteria, Urinalysis 08/17/2022 >50 Marland Kitchen)  Squamous  Epithelial Cell* 08/17/2022 0    Hemoglobin A1C 08/17/2022 6.5 (H)    Average Blood Glucose (C* 08/17/2022 140   Office Visit on 08/05/2022  Component Date Value   ID Now COVID-19 - [facility]* 08/05/2022 Positive (!)      ASSESSMENT: Ms. Veldman is a 61 y.o. female presenting for consultation for atypical ductal hyperplasia.    Patient was oriented again about the pathology results. Surgical alternatives were discussed with patient including excisional biopsy of the right breast. Surgical technique and post operative care was discussed with patient. Risk of surgery was discussed with patient including but not limited to: wound infection, seroma, hematoma, brachial plexopathy, mondor's disease (thrombosis of small veins of breast), chronic wound pain, breast lymphedema, altered sensation to the nipple and cosmesis among others.   Patient was oriented about the implication of having atypical ductal hyperplasia.  She was oriented about the goal of the surgery that is diagnostic, not therapeutic.  Discussed with patient the low probability of upgrading to ductal carcinoma in situ.  Also discussed with patient the high risk of breast cancer due to atypical hyperplasia.  Discussed with patient outpatient referral to medical oncology after the excisional biopsy results to discuss chemoprevention of risk of breast cancer alternatives.  The patient reports she understood the plan and agreed to proceed.  Atypical ductal hyperplasia of right breast [N60.91]  PLAN: 1.  Right breast radiofrequency tag excisional biopsy (19125) 2.  CBC, CMP done 3.  Hold aspirin 5 days before the procedure 4.  Contact us if you have any concern  Patient verbalized understanding, all questions were answered, and were agreeable with the plan outlined above.     Herbert Pun, MD  Electronically signed by Herbert Pun, MD

## 2022-08-26 ENCOUNTER — Encounter
Admission: RE | Admit: 2022-08-26 | Discharge: 2022-08-26 | Disposition: A | Discharge status: Home / Self Care | Payer: BC Managed Care – PPO | Source: Ambulatory Visit | Attending: General Surgery | Admitting: General Surgery

## 2022-08-26 ENCOUNTER — Other Ambulatory Visit: Payer: Self-pay

## 2022-08-26 DIAGNOSIS — Z01812 Encounter for preprocedural laboratory examination: Secondary | ICD-10-CM

## 2022-08-26 HISTORY — DX: Anxiety disorder, unspecified: F41.9

## 2022-08-26 NOTE — Patient Instructions (Addendum)
Your procedure is scheduled on: 09/01/22 - Wednesday Report to the Registration Desk on the 1st floor of the [facility]. To find out your arrival time, please call [phone] between 1PM - 3PM on: 08/31/22 - Tuesday If your arrival time is 6:00 am, do not arrive prior to that time as the [facility] entrance doors do not open until 6:00 am.  REMEMBER: Instructions that are not followed completely may result in serious medical risk, up to and including death; or upon the discretion of your surgeon and anesthesiologist your surgery may need to be rescheduled.  Do not eat food or drink any fluids after midnight the night before surgery.  No gum chewing, lozengers or hard candies.   TAKE THESE MEDICATIONS THE MORNING OF SURGERY WITH A SIP OF WATER:  - levothyroxine (SYNTHROID)   Inject 1/2 of your prescribed Insulin Glargine on the night before your surgery.  HOLD metFORMIN (GLUCOPHAGE) beginning 08/30/22, may resume taking on the day after your surgery.  HOLD aspirin 81 beginning 08/27/22.  One week prior to surgery: Stop taking beginning 08/26/22. Stop Anti-inflammatories (NSAIDS) such as Advil, Aleve, Ibuprofen, Motrin, Naproxen, Naprosyn and Aspirin based products such as Excedrin, Goodys Powder, BC Powder.  Stop ANY OVER THE COUNTER supplements until after surgery. Vitamin C- Zinc.  You may  take Tylenol if needed for pain up until the day of surgery.  No Alcohol for 24 hours before or after surgery.  No Smoking including e-cigarettes for 24 hours prior to surgery.  No chewable tobacco products for at least 6 hours prior to surgery.  No nicotine patches on the day of surgery.  Do not use any "recreational" drugs for at least a week prior to your surgery.  Please be advised that the combination of cocaine and anesthesia may have negative outcomes, up to and including death. If you test positive for cocaine, your surgery will be cancelled.  On the morning of surgery  brush your teeth with toothpaste and water, you may rinse your mouth with mouthwash if you wish. Do not swallow any toothpaste or mouthwash.  Use CHG Soap or wipes as directed on instruction sheet.  Do not wear jewelry, make-up, hairpins, clips or nail polish.  Do not wear lotions, powders, or perfumes.   Do not shave body from the neck down 48 hours prior to surgery just in case you cut yourself which could leave a site for infection.  Also, freshly shaved skin may become irritated if using the CHG soap.  Contact lenses, hearing aids and dentures may not be worn into surgery.  Do not bring valuables to the hospital. [facility] is not responsible for any missing/lost belongings or valuables.   Bring your C-PAP to the hospital with you in case you may have to spend the night.   Notify your doctor if there is any change in your medical condition (cold, fever, infection).  Wear comfortable clothing (specific to your surgery type) to the hospital.  After surgery, you can help prevent lung complications by doing breathing exercises.  Take deep breaths and cough every 1-2 hours. Your doctor may order a device called an Incentive Spirometer to help you take deep breaths. When coughing or sneezing, hold a pillow firmly against your incision with both hands. This is called "splinting." Doing this helps protect your incision. It also decreases belly discomfort.  If you are being admitted to the hospital overnight, leave your suitcase in the car. After surgery it may be brought to  your room.  If you are being discharged the day of surgery, you will not be allowed to drive home. You will need a responsible adult (18 years or older) to drive you home and stay with you that night.   If you are taking public transportation, you will need to have a responsible adult (18 years or older) with you. Please confirm with your physician that it is acceptable to use public transportation.   Please call  the [place] Dept. at [phone] if you have any questions about these instructions.  Surgery Visitation Policy:  Patients undergoing a surgery or procedure may have two family members or support persons with them as long as the person is not COVID-19 positive or experiencing its symptoms.   Inpatient Visitation:    Visiting hours are 7 a.m. to 8 p.m. Up to four visitors are allowed at one time in a patient room, including children. The visitors may rotate out with other people during the day. One designated support person (adult) may remain overnight.

## 2022-08-27 ENCOUNTER — Other Ambulatory Visit: Payer: BC Managed Care – PPO

## 2022-08-27 ENCOUNTER — Encounter
Admission: RE | Admit: 2022-08-27 | Discharge: 2022-08-27 | Disposition: A | Discharge status: Home / Self Care | Payer: BC Managed Care – PPO | Source: Ambulatory Visit | Attending: General Surgery | Admitting: General Surgery

## 2022-08-27 DIAGNOSIS — Z01810 Encounter for preprocedural cardiovascular examination: Secondary | ICD-10-CM | POA: Insufficient documentation

## 2022-08-27 DIAGNOSIS — Z01812 Encounter for preprocedural laboratory examination: Secondary | ICD-10-CM

## 2022-08-30 ENCOUNTER — Ambulatory Visit
Admission: RE | Admit: 2022-08-30 | Discharge: 2022-08-30 | Disposition: A | Discharge status: Home / Self Care | Payer: BC Managed Care – PPO | Source: Ambulatory Visit | Attending: General Surgery | Admitting: General Surgery

## 2022-08-30 DIAGNOSIS — N6091 Unspecified benign mammary dysplasia of right breast: Secondary | ICD-10-CM | POA: Insufficient documentation

## 2022-09-01 ENCOUNTER — Ambulatory Visit: Payer: BC Managed Care – PPO | Admitting: Certified Registered"

## 2022-09-01 ENCOUNTER — Other Ambulatory Visit: Payer: Self-pay

## 2022-09-01 ENCOUNTER — Ambulatory Visit
Admission: RE | Admit: 2022-09-01 | Discharge: 2022-09-01 | Disposition: A | Discharge status: Home / Self Care | Payer: BC Managed Care – PPO | Source: Ambulatory Visit | Attending: General Surgery | Admitting: General Surgery

## 2022-09-01 ENCOUNTER — Encounter: Payer: Self-pay | Admitting: General Surgery

## 2022-09-01 ENCOUNTER — Encounter
Admission: RE | Disposition: A | Discharge status: Home / Self Care | Payer: Self-pay | Source: Ambulatory Visit | Attending: General Surgery

## 2022-09-01 DIAGNOSIS — N6091 Unspecified benign mammary dysplasia of right breast: Secondary | ICD-10-CM

## 2022-09-01 DIAGNOSIS — N6315 Unspecified lump in the right breast, overlapping quadrants: Secondary | ICD-10-CM | POA: Diagnosis present

## 2022-09-01 DIAGNOSIS — Z01812 Encounter for preprocedural laboratory examination: Secondary | ICD-10-CM

## 2022-09-01 HISTORY — PX: EXCISION OF BREAST BIOPSY: SHX5822

## 2022-09-01 LAB — GLUCOSE, CAPILLARY
Glucose-Capillary: 153 mg/dL — ABNORMAL HIGH (ref 70–99)
Glucose-Capillary: 133 mg/dL — ABNORMAL HIGH (ref 70–99)

## 2022-09-01 SURGERY — EXCISION OF BREAST BIOPSY
Anesthesia: General | Site: Breast | Laterality: Right

## 2022-09-01 MED ORDER — ONDANSETRON HCL 4 MG/2ML IJ SOLN
INTRAMUSCULAR | Status: DC | PRN
  Administered 2022-09-01: 4 mg via INTRAVENOUS

## 2022-09-01 MED ORDER — FENTANYL CITRATE (PF) 100 MCG/2ML IJ SOLN
INTRAMUSCULAR | Status: AC
  Filled 2022-09-01: qty 2

## 2022-09-01 MED ORDER — STERILE WATER FOR IRRIGATION IR SOLN
Status: DC | PRN
  Administered 2022-09-01: 500 mL

## 2022-09-01 MED ORDER — FENTANYL CITRATE (PF) 100 MCG/2ML IJ SOLN: 25.0000 ug | INTRAMUSCULAR | Status: DC | PRN

## 2022-09-01 MED ORDER — MIDAZOLAM HCL 2 MG/2ML IJ SOLN
INTRAMUSCULAR | Status: AC
  Filled 2022-09-01: qty 2

## 2022-09-01 MED ORDER — FAMOTIDINE 20 MG PO TABS
ORAL_TABLET | ORAL | Status: AC
  Administered 2022-09-01: 20 mg via ORAL
  Filled 2022-09-01: qty 1

## 2022-09-01 MED ORDER — CHLORHEXIDINE GLUCONATE 0.12 % MT SOLN
OROMUCOSAL | Status: AC
  Administered 2022-09-01: 15 mL via OROMUCOSAL
  Filled 2022-09-01: qty 15

## 2022-09-01 MED ORDER — PROPOFOL 10 MG/ML IV BOLUS
INTRAVENOUS | Status: DC | PRN
  Administered 2022-09-01: 140 mg via INTRAVENOUS

## 2022-09-01 MED ORDER — SODIUM CHLORIDE 0.9 % IV SOLN: INTRAVENOUS | Status: DC

## 2022-09-01 MED ORDER — BUPIVACAINE-EPINEPHRINE (PF) 0.5% -1:200000 IJ SOLN
INTRAMUSCULAR | Status: DC | PRN
  Administered 2022-09-01: 30 mL via PERINEURAL

## 2022-09-01 MED ORDER — HYDROCODONE-ACETAMINOPHEN 5-325 MG PO TABS: 1.0000 | ORAL_TABLET | ORAL | 0 refills | Status: AC | PRN

## 2022-09-01 MED ORDER — ONDANSETRON HCL 4 MG/2ML IJ SOLN: 4.0000 mg | Freq: Once | INTRAMUSCULAR | Status: DC | PRN

## 2022-09-01 MED ORDER — FENTANYL CITRATE (PF) 100 MCG/2ML IJ SOLN
INTRAMUSCULAR | Status: DC | PRN
  Administered 2022-09-01 (×2): 50 ug via INTRAVENOUS

## 2022-09-01 MED ORDER — LIDOCAINE HCL (CARDIAC) PF 100 MG/5ML IV SOSY
PREFILLED_SYRINGE | INTRAVENOUS | Status: DC | PRN
  Administered 2022-09-01: 50 mg via INTRAVENOUS

## 2022-09-01 MED ORDER — CHLORHEXIDINE GLUCONATE 0.12 % MT SOLN: 15.0000 mL | Freq: Once | OROMUCOSAL | Status: AC

## 2022-09-01 MED ORDER — ORAL CARE MOUTH RINSE: 15.0000 mL | Freq: Once | OROMUCOSAL | Status: AC

## 2022-09-01 MED ORDER — ROCURONIUM BROMIDE 100 MG/10ML IV SOLN
INTRAVENOUS | Status: DC | PRN
  Administered 2022-09-01: 50 mg via INTRAVENOUS

## 2022-09-01 MED ORDER — CEFAZOLIN SODIUM-DEXTROSE 2-4 GM/100ML-% IV SOLN
INTRAVENOUS | Status: AC
  Filled 2022-09-01: qty 100

## 2022-09-01 MED ORDER — SUGAMMADEX SODIUM 200 MG/2ML IV SOLN
INTRAVENOUS | Status: DC | PRN
  Administered 2022-09-01: 300 mg via INTRAVENOUS

## 2022-09-01 MED ORDER — FAMOTIDINE 20 MG PO TABS: 20.0000 mg | ORAL_TABLET | Freq: Once | ORAL | Status: AC

## 2022-09-01 MED ORDER — BUPIVACAINE-EPINEPHRINE (PF) 0.5% -1:200000 IJ SOLN
INTRAMUSCULAR | Status: AC
  Filled 2022-09-01: qty 30

## 2022-09-01 MED ORDER — MIDAZOLAM HCL 2 MG/2ML IJ SOLN
INTRAMUSCULAR | Status: DC | PRN
  Administered 2022-09-01: 2 mg via INTRAVENOUS

## 2022-09-01 MED ORDER — CEFAZOLIN SODIUM-DEXTROSE 2-4 GM/100ML-% IV SOLN
2.0000 g | INTRAVENOUS | Status: AC
  Administered 2022-09-01: 2 g via INTRAVENOUS

## 2022-09-01 MED ORDER — DEXAMETHASONE SODIUM PHOSPHATE 10 MG/ML IJ SOLN
INTRAMUSCULAR | Status: DC | PRN
  Administered 2022-09-01: 5 mg via INTRAVENOUS

## 2022-09-01 MED ORDER — SODIUM CHLORIDE 0.9 % IV SOLN: INTRAVENOUS | Status: DC | PRN

## 2022-09-01 SURGICAL SUPPLY — 43 items
BLADE SURG 15 STRL LF DISP TIS (BLADE) ×1 IMPLANT
BLADE SURG 15 STRL SS (BLADE) ×1
CHLORAPREP W/TINT 26 (MISCELLANEOUS) ×1 IMPLANT
CNTNR SPEC 2.5X3XGRAD LEK (MISCELLANEOUS) ×1
CONT SPEC 4OZ STER OR WHT (MISCELLANEOUS) ×1
CONTAINER SPEC 2.5X3XGRAD LEK (MISCELLANEOUS) ×1 IMPLANT
DERMABOND ADVANCED .7 DNX12 (GAUZE/BANDAGES/DRESSINGS) ×1 IMPLANT
DEVICE DUBIN SPECIMEN MAMMOGRA (MISCELLANEOUS) ×1 IMPLANT
DRAPE LAPAROTOMY TRNSV 106X77 (MISCELLANEOUS) ×1 IMPLANT
ELECT CAUTERY BLADE TIP 2.5 (TIP) ×1
ELECT REM PT RETURN 9FT ADLT (ELECTROSURGICAL) ×1
ELECTRODE CAUTERY BLDE TIP 2.5 (TIP) ×1 IMPLANT
ELECTRODE REM PT RTRN 9FT ADLT (ELECTROSURGICAL) ×1 IMPLANT
GAUZE 4X4 16PLY ~~LOC~~+RFID DBL (SPONGE) ×1 IMPLANT
GLOVE BIO SURGEON STRL SZ 6.5 (GLOVE) ×1 IMPLANT
GLOVE BIOGEL PI IND STRL 6.5 (GLOVE) ×1 IMPLANT
GOWN STRL REUS W/ TWL LRG LVL3 (GOWN DISPOSABLE) ×3 IMPLANT
GOWN STRL REUS W/TWL LRG LVL3 (GOWN DISPOSABLE) ×3
KIT MARKER MARGIN INK (KITS) IMPLANT
KIT TURNOVER KIT A (KITS) ×1 IMPLANT
LABEL OR SOLS (LABEL) ×1 IMPLANT
MANIFOLD NEPTUNE II (INSTRUMENTS) ×1 IMPLANT
MARGIN MAP 10MM (MISCELLANEOUS) ×1 IMPLANT
MARKER MARGIN CORRECT CLIP (MARKER) IMPLANT
NDL HYPO 25X1 1.5 SAFETY (NEEDLE) ×1 IMPLANT
NEEDLE HYPO 25X1 1.5 SAFETY (NEEDLE) ×1 IMPLANT
PACK BASIN MINOR ARMC (MISCELLANEOUS) ×1 IMPLANT
RETRACTOR RING XSMALL (MISCELLANEOUS) IMPLANT
RTRCTR WOUND ALEXIS 13CM XS SH (MISCELLANEOUS) ×1
SUT ETHILON 3-0 FS-10 30 BLK (SUTURE) ×1
SUT MNCRL 4-0 (SUTURE) ×1
SUT MNCRL 4-0 27XMFL (SUTURE) ×1
SUT SILK 2 0 SH (SUTURE) ×1 IMPLANT
SUT VIC AB 3-0 SH 27 (SUTURE) ×1
SUT VIC AB 3-0 SH 27X BRD (SUTURE) ×1 IMPLANT
SUTURE EHLN 3-0 FS-10 30 BLK (SUTURE) ×1 IMPLANT
SUTURE MNCRL 4-0 27XMF (SUTURE) ×1 IMPLANT
SYR 10ML LL (SYRINGE) ×1 IMPLANT
SYR BULB IRRIG 60ML STRL (SYRINGE) ×1 IMPLANT
TRAP FLUID SMOKE EVACUATOR (MISCELLANEOUS) ×1 IMPLANT
TRAP NEPTUNE SPECIMEN COLLECT (MISCELLANEOUS) ×1 IMPLANT
WATER STERILE IRR 1000ML POUR (IV SOLUTION) ×1 IMPLANT
WATER STERILE IRR 500ML POUR (IV SOLUTION) ×1 IMPLANT

## 2022-09-01 NOTE — Anesthesia Preprocedure Evaluation (Signed)
Anesthesia Evaluation  Patient identified by MRN, date of birth, ID band Patient awake    Reviewed: Allergy & Precautions, H&P , NPO status , Patient's Chart, lab work & pertinent test results, reviewed documented beta blocker date and time   Airway Mallampati: II  TM Distance: >3 FB Neck ROM: full    Dental  (+) Teeth Intact   Pulmonary sleep apnea ,    Pulmonary exam normal        Cardiovascular Exercise Tolerance: Good hypertension, On Medications negative cardio ROS Normal cardiovascular exam Rate:Normal     Neuro/Psych Anxiety negative neurological ROS  negative psych ROS   GI/Hepatic negative GI ROS, Neg liver ROS,   Endo/Other  diabetes, Well ControlledHypothyroidism   Renal/GU negative Renal ROS  negative genitourinary   Musculoskeletal   Abdominal   Peds  Hematology  (+) Blood dyscrasia, anemia ,   Anesthesia Other Findings   Reproductive/Obstetrics negative OB ROS                             Anesthesia Physical Anesthesia Plan  ASA: 3  Anesthesia Plan: General LMA   Post-op Pain Management:    Induction:   PONV Risk Score and Plan:   Airway Management Planned:   Additional Equipment:   Intra-op Plan:   Post-operative Plan:   Informed Consent: I have reviewed the patients History and Physical, chart, labs and discussed the procedure including the risks, benefits and alternatives for the proposed anesthesia with the patient or authorized representative who has indicated his/her understanding and acceptance.       Plan Discussed with: CRNA  Anesthesia Plan Comments:         Anesthesia Quick Evaluation

## 2022-09-01 NOTE — Interval H&P Note (Signed)
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  The various methods of treatment have been discussed with the patient and family. After consideration of risks, benefits and other options for treatment, the patient has consented to  Procedure(s): EXCISION OF BREAST BIOPSY w/ RF (Right) as a surgical intervention.  The patient's history has been reviewed, patient examined, no change in status, stable for surgery.  I have reviewed the patient's chart and labs.  Questions were answered to the patient's satisfaction.     Herbert Pun

## 2022-09-01 NOTE — Op Note (Signed)
Preoperative diagnosis: Right breast atypical ductal hyperplasia.  Postoperative diagnosis: Same.   Procedure: Right radiofrequency tag-localized excisional biopsy                      Anesthesia: GETA  Surgeon: Dr. Windell Moment  Wound Classification: Clean  Indications: Patient is a 61 y.o. female with a nonpalpable right breast mass noted on mammography with core biopsy demonstrating at least atypical ductal hyperplasia requires radiofrequency tag-localized excisional biopsy to rule out malignancy.   Findings: 1. Specimen mammography shows marker and tag on specimen 2. No other palpable mass or lymph node identified.   Description of procedure: Preoperative radiofrequency tag localization was performed by radiology. Localization studies were reviewed. The patient was taken to the operating room and placed supine on the operating table, and after general anesthesia the right chest and axilla were prepped and draped in the usual sterile fashion. A time-out was completed verifying correct patient, procedure, site, positioning, and implant(s) and/or special equipment prior to beginning this procedure.  By comparing the localization studies and interrogation with [facility] device, the probable trajectory and location of the mass was visualized. A circumareolar skin incision was planned in such a way as to minimize the amount of dissection to reach the mass.  The skin incision was made. Flaps were raised and the location of the tag was confirmed with [facility] scout device confirmed. Dissection was then taken down circumferentially, taking care to include the entire localizing tag and a wide margin of grossly normal tissue. The specimen and entire localizing tag were removed. The specimen was oriented and sent to radiology with the localization studies. Confirmation was received that the entire target lesion had been resected. The wound was irrigated. Hemostasis was checked. The wound was closed with  interrupted sutures of 3-0 Vicryl and a subcuticular suture of Monocryl 3-0. No attempt was made to close the dead space.   Specimen: Right Breast excisional biopsy                     Complications: None  Estimated Blood Loss: 5 mL

## 2022-09-01 NOTE — Discharge Instructions (Addendum)

## 2022-09-01 NOTE — Transfer of Care (Signed)
Immediate Anesthesia Transfer of Care Note  Patient: Jill Yu  Procedure(s) Performed: EXCISION OF BREAST BIOPSY w/ RF (Right: Breast)  Patient Location: PACU  Anesthesia Type:General  Level of Consciousness: drowsy  Airway & Oxygen Therapy: Patient Spontanous Breathing and Patient connected to face mask oxygen  Post-op Assessment: Report given to RN and Post -op Vital signs reviewed and stable  Post vital signs: Reviewed and stable  Last Vitals:  Vitals Value Taken Time  BP    Temp    Pulse    Resp    SpO2      Last Pain:  Vitals:   09/01/22 1000  TempSrc: Oral  PainSc: 0-No pain         Complications: No notable events documented.

## 2022-09-01 NOTE — Anesthesia Procedure Notes (Signed)
Procedure Name: Intubation Date/Time: 09/01/2022 11:51 AM  Performed by: Beverely Low, CRNAPre-anesthesia Checklist: Patient identified, Patient being monitored, Timeout performed, Emergency Drugs available and Suction available Patient Re-evaluated:Patient Re-evaluated prior to induction Oxygen Delivery Method: Circle system utilized Preoxygenation: Pre-oxygenation with 100% oxygen Induction Type: IV induction Ventilation: Mask ventilation without difficulty Laryngoscope Size: 3 and McGraph Grade View: Grade I Tube type: Oral Tube size: 7.0 mm Number of attempts: 1 Airway Equipment and Method: Stylet Placement Confirmation: ETT inserted through vocal cords under direct vision, positive ETCO2 and breath sounds checked- equal and bilateral Secured at: 21 cm Tube secured with: Tape Dental Injury: Teeth and Oropharynx as per pre-operative assessment

## 2022-09-02 ENCOUNTER — Encounter: Payer: Self-pay | Admitting: General Surgery

## 2022-09-02 NOTE — Anesthesia Postprocedure Evaluation (Signed)
Anesthesia Post Note  Patient: Jill Yu  Procedure(s) Performed: EXCISION OF BREAST BIOPSY w/ RF (Right: Breast)  Patient location during evaluation: PACU Anesthesia Type: General Level of consciousness: awake and alert Pain management: pain level controlled Vital Signs Assessment: post-procedure vital signs reviewed and stable Respiratory status: spontaneous breathing, nonlabored ventilation, respiratory function stable and patient connected to nasal cannula oxygen Cardiovascular status: blood pressure returned to baseline and stable Postop Assessment: no apparent nausea or vomiting Anesthetic complications: no   No notable events documented.   Last Vitals:  Vitals:   09/01/22 1315 09/01/22 1338  BP: (!) 128/58 (!) 149/76  Pulse: 70 72  Resp: 17 15  Temp: (!) 36.4 C (!) 36.2 C  SpO2: 100% 100%    Last Pain:  Vitals:   09/01/22 1338  TempSrc: Temporal  PainSc: 0-No pain                 Molli Barrows

## 2022-09-06 LAB — SURGICAL PATHOLOGY

## 2022-10-07 ENCOUNTER — Inpatient Hospital Stay: Payer: BC Managed Care – PPO

## 2022-10-07 ENCOUNTER — Inpatient Hospital Stay: Payer: BC Managed Care – PPO | Attending: Oncology | Admitting: Oncology

## 2022-10-07 ENCOUNTER — Encounter: Payer: Self-pay | Admitting: Oncology

## 2022-10-07 VITALS — BP 139/56 | HR 72 | Temp 97.7°F | Resp 18 | Ht 65.0 in | Wt 224.6 lb

## 2022-10-07 DIAGNOSIS — Z8349 Family history of other endocrine, nutritional and metabolic diseases: Secondary | ICD-10-CM | POA: Insufficient documentation

## 2022-10-07 DIAGNOSIS — N6091 Unspecified benign mammary dysplasia of right breast: Secondary | ICD-10-CM | POA: Diagnosis not present

## 2022-10-07 DIAGNOSIS — Z800 Family history of malignant neoplasm of digestive organs: Secondary | ICD-10-CM | POA: Diagnosis not present

## 2022-10-07 DIAGNOSIS — Z79899 Other long term (current) drug therapy: Secondary | ICD-10-CM | POA: Insufficient documentation

## 2022-10-07 DIAGNOSIS — Z833 Family history of diabetes mellitus: Secondary | ICD-10-CM | POA: Insufficient documentation

## 2022-10-07 DIAGNOSIS — Z882 Allergy status to sulfonamides status: Secondary | ICD-10-CM | POA: Diagnosis not present

## 2022-10-07 DIAGNOSIS — Z881 Allergy status to other antibiotic agents status: Secondary | ICD-10-CM | POA: Insufficient documentation

## 2022-10-07 DIAGNOSIS — N6315 Unspecified lump in the right breast, overlapping quadrants: Secondary | ICD-10-CM

## 2022-10-07 DIAGNOSIS — Z8719 Personal history of other diseases of the digestive system: Secondary | ICD-10-CM | POA: Insufficient documentation

## 2022-10-07 DIAGNOSIS — G8929 Other chronic pain: Secondary | ICD-10-CM | POA: Insufficient documentation

## 2022-10-07 DIAGNOSIS — Z809 Family history of malignant neoplasm, unspecified: Secondary | ICD-10-CM

## 2022-10-07 DIAGNOSIS — M1990 Unspecified osteoarthritis, unspecified site: Secondary | ICD-10-CM | POA: Insufficient documentation

## 2022-10-07 DIAGNOSIS — Z803 Family history of malignant neoplasm of breast: Secondary | ICD-10-CM

## 2022-10-07 DIAGNOSIS — Z8249 Family history of ischemic heart disease and other diseases of the circulatory system: Secondary | ICD-10-CM | POA: Diagnosis not present

## 2022-10-07 DIAGNOSIS — N6099 Unspecified benign mammary dysplasia of unspecified breast: Secondary | ICD-10-CM

## 2022-10-08 DIAGNOSIS — Z809 Family history of malignant neoplasm, unspecified: Secondary | ICD-10-CM | POA: Insufficient documentation

## 2022-10-08 DIAGNOSIS — N6099 Unspecified benign mammary dysplasia of unspecified breast: Secondary | ICD-10-CM | POA: Insufficient documentation

## 2022-10-08 NOTE — Progress Notes (Signed)
Hematology/Oncology Consult Note [phone] [phone]     REFERRING PROVIDER: Idelle Crouch, MD   Patient Care Team: Idelle Crouch, MD as PCP - General (Internal Medicine)  CHIEF COMPLAINTS/PURPOSE OF CONSULTATION:  Atypical ductal hyperplasia  HISTORY OF PRESENTING ILLNESS:  Jill Yu 61 y.o. female presents to establish care for atypical ductal hyperplasia  06/25/2022, bilateral screening mammogram recommend further workup for right breast possible mass.  Left breast showed no suspicious findings for malignancy. 07/15/2022, unilateral right diagnostic mammogram and ultrasound showed 4 mm mass in the right breast at 12:00.  No suspicious right axillary adenopathy. 08/03/2022, patient underwent right breast mass biopsy.  Pathology showed at least atypical ductal hyperplasia. 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   09/01/2022 patient underwent right breast mass excision.  Pathology showed tiny residual focus of atypical ductal hyperplasia.  Margins are negative for atypia and malignancy.  Prior biopsy site changes with clip.  Patient was referred to oncology for discussion of chemoprevention. Patient is postmenopausal. Family history of breast cancer.  She has osteoarthritis associated chronic joint pain. MEDICAL HISTORY:  Past Medical History:  Diagnosis Date   Anemia    Anxiety    situational when flying   Arthritis    Diabetes mellitus without complication ([place])    Endometrial hyperplasia    history of   Endometrial polyp 03/31/2015   Hyperlipidemia    Hypertension    Hypothyroidism    PMB (postmenopausal bleeding) 03/31/2015   Polyp of colon    Sleep apnea     SURGICAL  HISTORY: Past Surgical History:  Procedure Laterality Date   ANKLE FRACTURE SURGERY     BREAST BIOPSY Right 08/03/2022   [place] Core bx, Ribbon Clip 1200, [facility]   CARDIAC CATHETERIZATION  11/08/2013   COLONOSCOPY WITH PROPOFOL N/A 05/15/2019   Procedure: COLONOSCOPY WITH PROPOFOL;  Surgeon: Lucilla Lame, MD;  Location: [facility];  Service: Endoscopy;  Laterality: N/A;   CYSTOSCOPY     DILATION AND CURETTAGE OF UTERUS     EXCISION OF BREAST BIOPSY Right 09/01/2022   Procedure: EXCISION OF BREAST BIOPSY w/ RF;  Surgeon: Herbert Pun, MD;  Location: [facility] ORS;  Service: General;  Laterality: Right;   FRACTURE SURGERY     right ankle   HYSTEROSCOPY WITH D & C N/A 03/31/2015   Procedure: DILATATION AND CURETTAGE /HYSTEROSCOPY;  Surgeon: Brayton Mars, MD;  Location: [facility] ORS;  Service: Gynecology;  Laterality: N/A;   Hysteroscopy/D&C, polypectomy     JOINT REPLACEMENT     both knees   MENISCECTOMY Left    TONSILLECTOMY      SOCIAL HISTORY: Social History   Socioeconomic History   Marital status: Single    Spouse name: Not on file   Number of children: Not on file   Years of education: Not on file   Highest education level: Not on file  Occupational History   Not on file  Tobacco Use   Smoking status: Never   Smokeless tobacco: Never  Vaping Use   Vaping Use: Never used  Substance and Sexual Activity   Alcohol use: No    Alcohol/week: 0.0 standard drinks of alcohol   Drug use: No   Sexual activity: Not on file  Other  Topics Concern   Not on file  Social History Narrative   Lives alone   Social Determinants of Health   Financial Resource Strain: Not on file  Food Insecurity: Not on file  Transportation Needs: Not on file  Physical Activity: Not on file  Stress: Not on file  Social Connections: Not on file  Intimate Partner Violence: Not on file    FAMILY HISTORY: Family History  Problem Relation Age of Onset   Diabetes Mother        type 1    Hypothyroidism Mother    Hypertension Father    Squamous cell carcinoma Father    Breast cancer Paternal Aunt 63   Colon cancer Maternal Grandfather    Breast cancer Other        maternal great aunt   Diabetes Other        maternal cousins, aunts and uncles    ALLERGIES:  is allergic to cephalosporins and sulfa antibiotics.  MEDICATIONS:  Current Outpatient Medications  Medication Sig Dispense Refill   aspirin 81 MG tablet Take 81 mg by mouth daily.     atorvastatin (LIPITOR) 20 MG tablet Take 20 mg by mouth daily.     Insulin Glargine 300 UNIT/ML SOPN Inject 100 Units into the skin at bedtime.     insulin lispro protamine-lispro (HUMALOG 50/50 MIX) (50-50) 100 UNIT/ML SUSP injection Inject 4 Units into the skin as needed.     levothyroxine (SYNTHROID) 75 MCG tablet Take 75 mcg by mouth daily before breakfast.     lisinopril (PRINIVIL,ZESTRIL) 20 MG tablet Take 20 mg by mouth 2 (two) times daily.     metFORMIN (GLUCOPHAGE) 1000 MG tablet Take 1,000 mg by mouth 2 (two) times daily with a meal.     vitamin B-12 (CYANOCOBALAMIN) 250 MCG tablet Take 250 mcg by mouth daily.     vitamin C (ASCORBIC ACID) 250 MG tablet Take 250 mg by mouth daily.     zinc gluconate 50 MG tablet Take 50 mg by mouth daily.     pseudoephedrine-guaifenesin (MUCINEX D) 60-600 MG 12 hr tablet Take 1 tablet by mouth as needed for congestion. (Patient not taking: Reported on 10/07/2022)     No current facility-administered medications for this visit.    Review of Systems  Constitutional:  Negative for appetite change, chills, fatigue and fever.  HENT:   Negative for hearing loss and voice change.   Eyes:  Negative for eye problems.  Respiratory:  Negative for chest tightness and cough.   Cardiovascular:  Negative for chest pain.  Gastrointestinal:  Negative for abdominal distention, abdominal pain and blood in stool.  Endocrine: Negative for hot flashes.  Genitourinary:  Negative for difficulty urinating and  frequency.   Musculoskeletal:  Positive for arthralgias.  Skin:  Negative for itching and rash.  Neurological:  Negative for extremity weakness.  Hematological:  Negative for adenopathy.  Psychiatric/Behavioral:  Negative for confusion.      PHYSICAL EXAMINATION:  Vitals:   10/07/22 1506  BP: (!) 139/56  Pulse: 72  Resp: 18  Temp: 97.7 F (36.5 C)   Filed Weights   10/07/22 1506  Weight: 101.9 kg    Physical Exam Constitutional:      General: She is not in acute distress.    Appearance: She is not diaphoretic.  HENT:     Head: Normocephalic.     Nose: Nose normal.     Mouth/Throat:     Pharynx: No oropharyngeal exudate.  Eyes:  General: No scleral icterus.    Pupils: Pupils are equal, round, and reactive to light.  Cardiovascular:     Rate and Rhythm: Normal rate.  Pulmonary:     Effort: Pulmonary effort is normal. No respiratory distress.  Abdominal:     General: There is no distension.     Palpations: Abdomen is soft.  Musculoskeletal:        General: Normal range of motion.     Cervical back: Normal range of motion.  Skin:    Findings: No erythema.  Neurological:     Mental Status: She is alert and oriented to person, place, and time.     Cranial Nerves: No cranial nerve deficit.     Motor: No abnormal muscle tone.     Coordination: Coordination normal.  Psychiatric:        Mood and Affect: Affect normal.      LABORATORY DATA:  I have reviewed the data as listed    Latest Ref Rng & Units 03/31/2015    8:13 AM 01/16/2014    7:23 PM 10/04/2012   11:52 AM  CBC  WBC 3.6 - 11.0 K/uL 6.1  8.2  8.1   Hemoglobin 12.0 - 16.0 g/dL 13.9  14.2  12.8   Hematocrit 35.0 - 47.0 % 41.1  42.3  37.4   Platelets 150 - 440 K/uL 242  261  286       Latest Ref Rng & Units 03/31/2015    8:13 AM 01/16/2014    7:23 PM 10/04/2012   11:52 AM  CMP  Glucose 65 - 99 mg/dL 186  105  118   BUN 6 - 20 mg/dL '11  13  13   '$ Creatinine 0.44 - 1.00 mg/dL 1.11  1.05  1.02    Sodium 135 - 145 mmol/L 138  140  138   Potassium 3.5 - 5.1 mmol/L 4.2  4.1  4.2   Chloride 101 - 111 mmol/L 103  105  103   CO2 22 - 32 mmol/L '28  27  27   '$ Calcium 8.9 - 10.3 mg/dL 9.2  9.0  8.9      RADIOGRAPHIC STUDIES: I have personally reviewed the radiological images as listed and agreed with the findings in the report. No results found.  ASSESSMENT & PLAN:   Atypical ductal hyperplasia of breast Atypical ductal hyperplasia is associated with a generalized, bilateral increase in breast cancer risk. Recommendation: Active Surveillance: life time annual screening mammography as well as  history and physical examination every 6 to 12 months Endocrine therapy as chemoprevention.  For postmenopausal patient, recommend aromatase inhibitor for five years with assessment of baseline bone density. Side effects including but not limited to hot flush, joint pain, fatigue, mood swing, osteoporosis discussed with patient.  Patient opted to proceed with active surveillance due to concern of side effects and recent life stressors.  I recommend patient to continue follow up with Dr.Cintron for mammogram and physician examination.   Family history of cancer Refer to genetic counselor.    Orders Placed This Encounter  Procedures   Ambulatory referral to Genetics    Referral Priority:   Routine    Referral Type:   Consultation    Referral Reason:   Specialty Services Required    Number of Visits Requested:   1   Follow up PRN  All questions were answered. The patient knows to call the clinic with any problems, questions or concerns. No barriers to learning was detected.  Earlie Server, MD 10/07/2022

## 2022-10-08 NOTE — Assessment & Plan Note (Addendum)
Atypical ductal hyperplasia is associated with a generalized, bilateral increase in breast cancer risk. Recommendation: Active Surveillance: life time annual screening mammography as well as  history and physical examination every 6 to 12 months Endocrine therapy as chemoprevention.  For postmenopausal patient, recommend aromatase inhibitor for five years with assessment of baseline bone density. Side effects including but not limited to hot flush, joint pain, fatigue, mood swing, osteoporosis discussed with patient.  Patient opted to proceed with active surveillance due to concern of side effects and recent life stressors.  I recommend patient to continue follow up with Dr.Cintron for mammogram and physician examination.

## 2022-10-08 NOTE — Assessment & Plan Note (Signed)
Refer to genetic counselor.  

## 2022-10-27 ENCOUNTER — Encounter: Payer: BC Managed Care – PPO | Admitting: Licensed Clinical Social Worker

## 2022-10-27 ENCOUNTER — Other Ambulatory Visit: Payer: BC Managed Care – PPO

## 2023-05-26 ENCOUNTER — Other Ambulatory Visit: Payer: Self-pay | Admitting: Internal Medicine

## 2023-05-26 DIAGNOSIS — Z1231 Encounter for screening mammogram for malignant neoplasm of breast: Secondary | ICD-10-CM

## 2023-06-28 ENCOUNTER — Ambulatory Visit
Admission: RE | Admit: 2023-06-28 | Discharge: 2023-06-28 | Disposition: A | Discharge status: Home / Self Care | Payer: 59 | Source: Ambulatory Visit | Attending: Internal Medicine | Admitting: Internal Medicine

## 2023-06-28 DIAGNOSIS — Z1231 Encounter for screening mammogram for malignant neoplasm of breast: Secondary | ICD-10-CM | POA: Diagnosis present

## 2023-09-09 ENCOUNTER — Other Ambulatory Visit: Payer: Self-pay | Admitting: Internal Medicine

## 2023-09-09 DIAGNOSIS — R1084 Generalized abdominal pain: Secondary | ICD-10-CM

## 2023-09-16 ENCOUNTER — Inpatient Hospital Stay: Admission: RE | Admit: 2023-09-16 | Payer: 59 | Source: Ambulatory Visit

## 2023-09-19 ENCOUNTER — Ambulatory Visit
Admission: RE | Admit: 2023-09-19 | Discharge: 2023-09-19 | Disposition: A | Discharge status: Home / Self Care | Payer: 59 | Source: Ambulatory Visit | Attending: Internal Medicine | Admitting: Internal Medicine

## 2023-09-19 DIAGNOSIS — R1084 Generalized abdominal pain: Secondary | ICD-10-CM

## 2024-03-09 ENCOUNTER — Telehealth: Payer: Self-pay

## 2024-03-09 ENCOUNTER — Other Ambulatory Visit: Payer: Self-pay

## 2024-03-09 DIAGNOSIS — Z860109 Personal history of other colon polyps: Secondary | ICD-10-CM

## 2024-03-09 DIAGNOSIS — Z860100 Personal history of colon polyps, unspecified: Secondary | ICD-10-CM

## 2024-03-09 MED ORDER — NA SULFATE-K SULFATE-MG SULF 17.5-3.13-1.6 GM/177ML PO SOLN: 1.0000 | Freq: Once | ORAL | 0 refills | Status: AC

## 2024-03-09 NOTE — Telephone Encounter (Signed)
 Gastroenterology Pre-Procedure Review  Request Date: 06/05/24 Requesting Physician: Dr. Ole Berkeley  PATIENT REVIEW QUESTIONS: The patient responded to the following health history questions as indicated:    1. Are you having any GI issues? no 2. Do you have a personal history of Polyps? yes (last colonoscopy performed by Dr. Ole Berkeley 05/15/2019) 3. Do you have a family history of Colon Cancer or Polyps? no 4. Diabetes Mellitus? yes (has been advised to stop Metformin 2 days prior to procedure, and take 1/2 amount of insulin on the evening before procedure) 5. Joint replacements in the past 12 months?no 6. Major health problems in the past 3 months?no 7. Any artificial heart valves, MVP, or defibrillator?no    MEDICATIONS & ALLERGIES:    Patient reports the following regarding taking any anticoagulation/antiplatelet therapy:   Plavix, Coumadin, Eliquis, Xarelto, Lovenox, Pradaxa, Brilinta, or Effient? no Aspirin? no  Patient confirms/reports the following medications:  Current Outpatient Medications  Medication Sig Dispense Refill   ALPRAZolam (XANAX) 0.25 MG tablet 1-2 tablets every 8 hours as needed     insulin glargine-yfgn (SEMGLEE) 100 UNIT/ML Pen Inject 100 Units into the skin.     insulin lispro (HUMALOG) 100 UNIT/ML KwikPen Inject 80 Units into the skin.     metFORMIN (GLUCOPHAGE) 1000 MG tablet Take 1,000 mg by mouth 2 (two) times daily with a meal.     Na Sulfate-K Sulfate-Mg Sulfate concentrate (SUPREP) 17.5-3.13-1.6 GM/177ML SOLN Take 1 kit (354 mLs total) by mouth once for 1 dose. 354 mL 0   aspirin 81 MG tablet Take 81 mg by mouth daily.     atorvastatin (LIPITOR) 20 MG tablet Take 20 mg by mouth daily.     levothyroxine (SYNTHROID) 75 MCG tablet Take 75 mcg by mouth daily before breakfast.     lisinopril (PRINIVIL,ZESTRIL) 20 MG tablet Take 20 mg by mouth 2 (two) times daily.     vitamin B-12 (CYANOCOBALAMIN) 250 MCG tablet Take 250 mcg by mouth daily.     vitamin C (ASCORBIC  ACID) 250 MG tablet Take 250 mg by mouth daily.     zinc gluconate 50 MG tablet Take 50 mg by mouth daily.     No current facility-administered medications for this visit.    Patient confirms/reports the following allergies:  Allergies  Allergen Reactions   Cephalosporins Itching   Sulfa Antibiotics Itching    No orders of the defined types were placed in this encounter.   AUTHORIZATION INFORMATION Primary Insurance: 1D#: Group #:  Secondary Insurance: 1D#: Group #:  SCHEDULE INFORMATION: Date: 06/05/24 Time: Location: [facility]

## 2024-06-05 ENCOUNTER — Ambulatory Visit
Admission: RE | Admit: 2024-06-05 | Discharge: 2024-06-05 | Disposition: A | Discharge status: Home / Self Care | Attending: Gastroenterology | Admitting: Gastroenterology

## 2024-06-05 ENCOUNTER — Other Ambulatory Visit: Payer: Self-pay

## 2024-06-05 ENCOUNTER — Encounter
Admission: RE | Disposition: A | Discharge status: Home / Self Care | Payer: Self-pay | Source: Home / Self Care | Attending: Gastroenterology

## 2024-06-05 ENCOUNTER — Ambulatory Visit: Admitting: Anesthesiology

## 2024-06-05 ENCOUNTER — Encounter: Payer: Self-pay | Admitting: Gastroenterology

## 2024-06-05 DIAGNOSIS — Z79890 Hormone replacement therapy (postmenopausal): Secondary | ICD-10-CM | POA: Diagnosis not present

## 2024-06-05 DIAGNOSIS — Z833 Family history of diabetes mellitus: Secondary | ICD-10-CM | POA: Diagnosis not present

## 2024-06-05 DIAGNOSIS — Z7984 Long term (current) use of oral hypoglycemic drugs: Secondary | ICD-10-CM | POA: Diagnosis not present

## 2024-06-05 DIAGNOSIS — Z1211 Encounter for screening for malignant neoplasm of colon: Secondary | ICD-10-CM | POA: Diagnosis not present

## 2024-06-05 DIAGNOSIS — E669 Obesity, unspecified: Secondary | ICD-10-CM | POA: Diagnosis not present

## 2024-06-05 DIAGNOSIS — D649 Anemia, unspecified: Secondary | ICD-10-CM | POA: Diagnosis not present

## 2024-06-05 DIAGNOSIS — Z794 Long term (current) use of insulin: Secondary | ICD-10-CM | POA: Insufficient documentation

## 2024-06-05 DIAGNOSIS — Z79899 Other long term (current) drug therapy: Secondary | ICD-10-CM | POA: Insufficient documentation

## 2024-06-05 DIAGNOSIS — Z6838 Body mass index (BMI) 38.0-38.9, adult: Secondary | ICD-10-CM | POA: Insufficient documentation

## 2024-06-05 DIAGNOSIS — F419 Anxiety disorder, unspecified: Secondary | ICD-10-CM | POA: Diagnosis not present

## 2024-06-05 DIAGNOSIS — K5730 Diverticulosis of large intestine without perforation or abscess without bleeding: Secondary | ICD-10-CM | POA: Diagnosis not present

## 2024-06-05 DIAGNOSIS — E785 Hyperlipidemia, unspecified: Secondary | ICD-10-CM | POA: Insufficient documentation

## 2024-06-05 DIAGNOSIS — K635 Polyp of colon: Secondary | ICD-10-CM | POA: Insufficient documentation

## 2024-06-05 DIAGNOSIS — Z860109 Personal history of other colon polyps: Secondary | ICD-10-CM

## 2024-06-05 DIAGNOSIS — E119 Type 2 diabetes mellitus without complications: Secondary | ICD-10-CM | POA: Insufficient documentation

## 2024-06-05 DIAGNOSIS — G4730 Sleep apnea, unspecified: Secondary | ICD-10-CM | POA: Insufficient documentation

## 2024-06-05 DIAGNOSIS — K640 First degree hemorrhoids: Secondary | ICD-10-CM | POA: Insufficient documentation

## 2024-06-05 DIAGNOSIS — E039 Hypothyroidism, unspecified: Secondary | ICD-10-CM | POA: Diagnosis not present

## 2024-06-05 DIAGNOSIS — I38 Endocarditis, valve unspecified: Secondary | ICD-10-CM | POA: Diagnosis not present

## 2024-06-05 DIAGNOSIS — I10 Essential (primary) hypertension: Secondary | ICD-10-CM | POA: Insufficient documentation

## 2024-06-05 DIAGNOSIS — M1990 Unspecified osteoarthritis, unspecified site: Secondary | ICD-10-CM | POA: Insufficient documentation

## 2024-06-05 HISTORY — PX: POLYPECTOMY: SHX149

## 2024-06-05 HISTORY — PX: COLONOSCOPY: SHX5424

## 2024-06-05 LAB — GLUCOSE, CAPILLARY: Glucose-Capillary: 184 mg/dL — ABNORMAL HIGH (ref 70–99)

## 2024-06-05 SURGERY — COLONOSCOPY
Anesthesia: General

## 2024-06-05 MED ORDER — LIDOCAINE HCL (PF) 2 % IJ SOLN
INTRAMUSCULAR | Status: AC
  Filled 2024-06-05: qty 5

## 2024-06-05 MED ORDER — PROPOFOL 10 MG/ML IV BOLUS
INTRAVENOUS | Status: DC | PRN
  Administered 2024-06-05: 40 mg via INTRAVENOUS
  Administered 2024-06-05: 20 mg via INTRAVENOUS
  Administered 2024-06-05 (×5): 30 mg via INTRAVENOUS
  Administered 2024-06-05: 50 mg via INTRAVENOUS

## 2024-06-05 MED ORDER — SODIUM CHLORIDE 0.9 % IV SOLN
INTRAVENOUS | Status: DC
  Administered 2024-06-05: 500 mL via INTRAVENOUS

## 2024-06-05 MED ORDER — PROPOFOL 1000 MG/100ML IV EMUL
INTRAVENOUS | Status: AC
Start: 2024-06-05 — End: 2024-06-05
  Filled 2024-06-05: qty 100

## 2024-06-05 NOTE — Anesthesia Postprocedure Evaluation (Signed)
 Anesthesia Post Note  Patient: Jill Yu  Procedure(s) Performed: COLONOSCOPY POLYPECTOMY, INTESTINE  Patient location during evaluation: PACU Anesthesia Type: General Level of consciousness: awake and alert, oriented and patient cooperative Pain management: pain level controlled Vital Signs Assessment: post-procedure vital signs reviewed and stable Respiratory status: spontaneous breathing, nonlabored ventilation and respiratory function stable Cardiovascular status: blood pressure returned to baseline and stable Postop Assessment: adequate PO intake Anesthetic complications: no   No notable events documented.   Last Vitals:  Vitals:   06/05/24 0821 06/05/24 0830  BP: (!) 119/38 (!) 112/45  Pulse: 71 70  Resp: 16 15  Temp:    SpO2: 100% 98%    Last Pain:  Vitals:   06/05/24 0820  TempSrc: Temporal  PainSc:                  Alfonso Ruths

## 2024-06-05 NOTE — Op Note (Addendum)
 [facility] Gastroenterology Patient Name: Jill Yu Procedure Date: 06/05/2024 7:18 AM MRN: [id number] Account #: [id number] Date of Birth: 07-13-1961 Admit Type: Outpatient Age: 63 Room: [facility] ENDO ROOM 4 Gender: Female Note Status: Finalized Instrument Name: [id number] Procedure:             Colonoscopy Indications:           High risk colon cancer surveillance: Personal history                         of colonic polyps Providers:             Rogelia Copping MD, MD Referring MD:          Reyes BIRCH. Auston, MD (Referring MD) Medicines:             Propofol  per Anesthesia Complications:         No immediate complications. Procedure:             Pre-Anesthesia Assessment:                        - Prior to the procedure, a History and Physical was                         performed, and patient medications and allergies were                         reviewed. The patient's tolerance of previous                         anesthesia was also reviewed. The risks and benefits                         of the procedure and the sedation options and risks                         were discussed with the patient. All questions were                         answered, and informed consent was obtained. Prior                         Anticoagulants: The patient has taken no anticoagulant                         or antiplatelet agents. ASA Grade Assessment: II - A                         patient with mild systemic disease. After reviewing                         the risks and benefits, the patient was deemed in                         satisfactory condition to undergo the procedure.                        After obtaining informed consent, the colonoscope was  passed under direct vision. Throughout the procedure,                         the patient's blood pressure, pulse, and oxygen                         saturations were monitored continuously. The                          Colonoscope was introduced through the anus and                         advanced to the the cecum, identified by appendiceal                         orifice and ileocecal valve. The colonoscopy was                         performed without difficulty. The patient tolerated                         the procedure well. The quality of the bowel                         preparation was good. Findings:      The perianal and digital rectal examinations were normal.      Multiple small-mouthed diverticula were found in the sigmoid colon.      Non-bleeding internal hemorrhoids were found during retroflexion. The       hemorrhoids were Grade I (internal hemorrhoids that do not prolapse).      A 4 mm polyp was found in the sigmoid colon. The polyp was sessile. The       polyp was removed with a cold snare. Resection and retrieval were       complete. Impression:            - Diverticulosis in the sigmoid colon.                        - Non-bleeding internal hemorrhoids.                        - One 4 mm polyp in the sigmoid colon, removed with a                         cold snare. Resected and retrieved. Recommendation:        - Discharge patient to home.                        - Resume previous diet.                        - Continue present medications.                        - Await pathology results.                        - Repeat colonoscopy in 7 years for surveillance. Procedure Code(s):     --- Professional ---  54614, Colonoscopy, flexible; with removal of                         tumor(s), polyp(s), or other lesion(s) by snare                         technique Diagnosis Code(s):     --- Professional ---                        Z86.010, Personal history of colonic polyps                        D12.5, Benign neoplasm of sigmoid colon CPT copyright 2022 American [employer]. All rights reserved. The codes documented in this report are  preliminary and upon coder review may  be revised to meet current compliance requirements. Rogelia Copping MD, MD 06/05/2024 8:18:54 AM This report has been signed electronically. Number of Addenda: 0 Note Initiated On: 06/05/2024 7:18 AM Scope Withdrawal Time: 0 hours 8 minutes 26 seconds  Total Procedure Duration: 0 hours 18 minutes 17 seconds  Estimated Blood Loss:  Estimated blood loss: none.      [facility]

## 2024-06-05 NOTE — H&P (Signed)
 Jill Copping, MD [facility] [address] [place], [place] [address] [phone] Fax : [phone]  Primary Care Physician:  Auston Reyes BIRCH, MD Primary Gastroenterologist:  Dr. Copping  Pre-Procedure History & Physical: HPI:  Jill Yu is a 63 y.o. female is here for an colonoscopy.   Past Medical History:  Diagnosis Date   Anemia    Anxiety    situational when flying   Arthritis    Diabetes mellitus without complication (HCC)    Endometrial hyperplasia    history of   Endometrial polyp 03/31/2015   Hyperlipidemia    Hypertension    Hypothyroidism    PMB (postmenopausal bleeding) 03/31/2015   Polyp of colon    Sleep apnea     Past Surgical History:  Procedure Laterality Date   ANKLE FRACTURE SURGERY     BREAST BIOPSY Right 08/03/2022   US  Core bx, Ribbon Clip 1200, AD[facility]   CARDIAC CATHETERIZATION  11/08/2013   COLONOSCOPY WITH PROPOFOL  N/A 05/15/2019   Procedure: COLONOSCOPY WITH PROPOFOL ;  Surgeon: Yu Rogelia, MD;  Location: [facility] ENDOSCOPY;  Service: Endoscopy;  Laterality: N/A;   CYSTOSCOPY     DILATION AND CURETTAGE OF UTERUS     EXCISION OF BREAST BIOPSY Right 09/01/2022   Procedure: EXCISION OF BREAST BIOPSY w/ RF;  Surgeon: Rodolph Romano, MD;  Location: [facility] ORS;  Service: General;  Laterality: Right;   FRACTURE SURGERY     right ankle   HYSTEROSCOPY WITH D & C N/A 03/31/2015   Procedure: DILATATION AND CURETTAGE /HYSTEROSCOPY;  Surgeon: Gladis DELENA Dollar, MD;  Location: [facility] ORS;  Service: Gynecology;  Laterality: N/A;   Hysteroscopy/D&C, polypectomy     JOINT REPLACEMENT     both knees   MENISCECTOMY Left    TONSILLECTOMY      Prior to Admission medications   Medication Sig Start Date End Date Taking? Authorizing Provider  atorvastatin (LIPITOR) 20 MG tablet Take 20 mg by mouth daily.   Yes [provider]  insulin lispro (HUMALOG) 100 UNIT/ML KwikPen Inject 80 Units into the skin. 02/17/24 02/16/25 Yes [provider]  levothyroxine (SYNTHROID) 75 MCG tablet Take 75 mcg by mouth daily before breakfast.   Yes [provider]  lisinopril (PRINIVIL,ZESTRIL) 20 MG tablet Take 20 mg by mouth 2 (two) times daily.   Yes [provider]  vitamin B-12 (CYANOCOBALAMIN) 250 MCG tablet Take 250 mcg by mouth daily.   Yes [provider]  vitamin C (ASCORBIC ACID) 250 MG tablet Take 250 mg by mouth daily.   Yes [provider]  ALPRAZolam (XANAX) 0.25 MG tablet 1-2 tablets every 8 hours as needed 09/06/23   [provider]  aspirin 81 MG tablet Take 81 mg by mouth daily.    [provider]  metFORMIN (GLUCOPHAGE) 1000 MG tablet Take 1,000 mg by mouth 2 (two) times daily with a meal.    [provider]  zinc gluconate 50 MG tablet Take 50 mg by mouth daily. Patient not taking: Reported on 06/05/2024    [provider]    Allergies as of 03/09/2024 - Review Complete 03/09/2024  Allergen Reaction Noted   Cephalosporins Itching 03/28/2015   Sulfa antibiotics Itching 05/15/2019    Family History  Problem Relation Age of Onset   Diabetes Mother        type 1   Hypothyroidism Mother    Hypertension Father    Squamous cell carcinoma Father    Breast cancer Paternal Aunt  65   Colon cancer Maternal Grandfather    Breast cancer Other        maternal great aunt   Diabetes Other        maternal cousins, aunts and uncles    Social History   Socioeconomic History   Marital status: Single    Spouse name: Not on file   Number of children: Not on file   Years of education: Not on file   Highest education level: Not on file  Occupational History   Not on file  Tobacco Use   Smoking status: Never   Smokeless tobacco: Never  Vaping Use   Vaping status: Never Used  Substance and Sexual Activity   Alcohol use: No    Alcohol/week: 0.0 standard drinks of alcohol   Drug use: No   Sexual activity: Not on file  Other Topics Concern    Not on file  Social History Narrative   Lives alone   Social Drivers of Health   Financial Resource Strain: Low Risk  (09/28/2023)   Received from [facility]   Overall Financial Resource Strain (CARDIA)    Difficulty of Paying Living Expenses: Not hard at all  Food Insecurity: No Food Insecurity (09/28/2023)   Received from [facility]   Hunger Vital Sign    Within the past 12 months, you worried that your food would run out before you got the money to buy more.: Never true    Within the past 12 months, the food you bought just didn't last and you didn't have money to get more.: Never true  Transportation Needs: No Transportation Needs (09/28/2023)   Received from [facility] - Transportation    In the past 12 months, has lack of transportation kept you from medical appointments or from getting medications?: No    Lack of Transportation (Non-Medical): No  Physical Activity: Not on file  Stress: Not on file  Social Connections: Not on file  Intimate Partner Violence: Not on file    Review of Systems: See HPI, otherwise negative ROS  Physical Exam: BP 136/63   Pulse 73   Temp 97.7 F (36.5 C) (Oral)   Resp 12   Ht 5' 4 (1.626 m)   Wt 101 kg   SpO2 99%   BMI 38.21 kg/m  General:   Alert,  pleasant and cooperative in NAD Head:  Normocephalic and atraumatic. Neck:  Supple; no masses or thyromegaly. Lungs:  Clear throughout to auscultation.    Heart:  Regular rate and rhythm. Abdomen:  Soft, nontender and nondistended. Normal bowel sounds, without guarding, and without rebound.   Neurologic:  Alert and  oriented x4;  grossly normal neurologically.  Impression/Plan: Jill Yu is here for an colonoscopy to be performed for a history of adenomatous polyps on 2020   Risks, benefits, limitations, and alternatives regarding  colonoscopy have been reviewed with the patient.  Questions have been answered.   All parties agreeable.   Jill Copping, MD  06/05/2024, 7:44 AM

## 2024-06-05 NOTE — Anesthesia Preprocedure Evaluation (Addendum)
 Anesthesia Evaluation  Patient identified by MRN, date of birth, ID band Patient awake    Reviewed: Allergy & Precautions, NPO status , Patient's Chart, lab work & pertinent test results  History of Anesthesia Complications Negative for: history of anesthetic complications  Airway Mallampati: I   Neck ROM: Full    Dental no notable dental hx.    Pulmonary sleep apnea    Pulmonary exam normal breath sounds clear to auscultation       Cardiovascular hypertension, Normal cardiovascular exam Rhythm:Regular Rate:Normal  Echo 08/29/20:  NORMAL LEFT VENTRICULAR SYSTOLIC FUNCTION  NORMAL RIGHT VENTRICULAR SYSTOLIC FUNCTION  MILD VALVULAR REGURGITATION   NO VALVULAR STENOSIS     Neuro/Psych  PSYCHIATRIC DISORDERS Anxiety     negative neurological ROS     GI/Hepatic negative GI ROS,,,  Endo/Other  diabetes, Type 2, Insulin DependentHypothyroidism  Obesity   Renal/GU negative Renal ROS     Musculoskeletal  (+) Arthritis ,    Abdominal   Peds  Hematology  (+) Blood dyscrasia, anemia   Anesthesia Other Findings   Reproductive/Obstetrics                              Anesthesia Physical Anesthesia Plan  ASA: 3  Anesthesia Plan: General   Post-op Pain Management:    Induction: Intravenous  PONV Risk Score and Plan: 3 and Propofol  infusion, TIVA and Treatment may vary due to age or medical condition  Airway Management Planned: Natural Airway  Additional Equipment:   Intra-op Plan:   Post-operative Plan:   Informed Consent: I have reviewed the patients History and Physical, chart, labs and discussed the procedure including the risks, benefits and alternatives for the proposed anesthesia with the patient or authorized representative who has indicated his/her understanding and acceptance.       Plan Discussed with: CRNA  Anesthesia Plan Comments: (LMA/GETA backup discussed.  Patient  consented for risks of anesthesia including but not limited to:  - adverse reactions to medications - damage to eyes, teeth, lips or other oral mucosa - nerve damage due to positioning  - sore throat or hoarseness - damage to heart, brain, nerves, lungs, other parts of body or loss of life  Informed patient about role of CRNA in peri- and intra-operative care.  Patient voiced understanding.)         Anesthesia Quick Evaluation

## 2024-06-05 NOTE — Transfer of Care (Signed)
 Immediate Anesthesia Transfer of Care Note  Patient: Jill Yu  Procedure(s) Performed: COLONOSCOPY POLYPECTOMY, INTESTINE  Patient Location: PACU  Anesthesia Type:MAC  Level of Consciousness: awake and alert   Airway & Oxygen Therapy: Patient Spontanous Breathing and Patient connected to nasal cannula oxygen  Post-op Assessment: Report given to RN and Post -op Vital signs reviewed and stable  Post vital signs: Reviewed and stable  Last Vitals:  Vitals Value Taken Time  BP 113/38   Temp    Pulse 79   Resp 20   SpO2 99     Last Pain:  Vitals:   06/05/24 0729  TempSrc: Oral  PainSc: 0-No pain      Patients Stated Pain Goal: 8 (06/05/24 0729)  Complications: No notable events documented.

## 2024-06-06 ENCOUNTER — Ambulatory Visit: Payer: Self-pay | Admitting: Gastroenterology

## 2024-06-06 ENCOUNTER — Encounter: Payer: Self-pay | Admitting: Gastroenterology

## 2024-06-06 LAB — SURGICAL PATHOLOGY

## 2024-06-18 ENCOUNTER — Other Ambulatory Visit: Payer: Self-pay | Admitting: Internal Medicine

## 2024-06-18 DIAGNOSIS — Z1231 Encounter for screening mammogram for malignant neoplasm of breast: Secondary | ICD-10-CM

## 2024-07-05 ENCOUNTER — Ambulatory Visit
Admission: RE | Admit: 2024-07-05 | Discharge: 2024-07-05 | Disposition: A | Discharge status: Home / Self Care | Source: Ambulatory Visit | Attending: Internal Medicine | Admitting: Internal Medicine

## 2024-07-05 DIAGNOSIS — Z1231 Encounter for screening mammogram for malignant neoplasm of breast: Secondary | ICD-10-CM | POA: Insufficient documentation

## 2024-10-10 ENCOUNTER — Other Ambulatory Visit: Payer: Self-pay | Admitting: Certified Nurse Midwife

## 2024-10-10 DIAGNOSIS — N6311 Unspecified lump in the right breast, upper outer quadrant: Secondary | ICD-10-CM

## 2024-10-11 ENCOUNTER — Inpatient Hospital Stay: Admission: RE | Admit: 2024-10-11 | Discharge: 2024-10-11 | Attending: Certified Nurse Midwife

## 2024-10-11 ENCOUNTER — Other Ambulatory Visit (HOSPITAL_BASED_OUTPATIENT_CLINIC_OR_DEPARTMENT_OTHER): Payer: Self-pay | Admitting: Family Medicine

## 2024-10-11 DIAGNOSIS — N6311 Unspecified lump in the right breast, upper outer quadrant: Secondary | ICD-10-CM | POA: Insufficient documentation
